# Patient Record
Sex: Male | Born: 1990 | Race: White | Hispanic: No | Marital: Single | Smoking: Never smoker
Health system: Southern US, Community
[De-identification: ages and names within clinical notes are randomized; demographics above are authoritative.]

---
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
## 2009-04-20 ENCOUNTER — Emergency Department (HOSPITAL_COMMUNITY): Admission: EM | Admit: 2009-04-20 | Discharge: 2009-04-21 | Payer: Self-pay | Admitting: Emergency Medicine

## 2009-05-13 ENCOUNTER — Telehealth: Payer: Self-pay | Admitting: Family Medicine

## 2009-05-14 ENCOUNTER — Telehealth: Payer: Self-pay | Admitting: Family Medicine

## 2009-05-14 ENCOUNTER — Ambulatory Visit: Payer: Self-pay | Admitting: Family Medicine

## 2009-05-14 DIAGNOSIS — B80 Enterobiasis: Secondary | ICD-10-CM

## 2009-05-18 ENCOUNTER — Telehealth: Payer: Self-pay | Admitting: *Deleted

## 2009-05-18 ENCOUNTER — Ambulatory Visit: Payer: Self-pay | Admitting: Family Medicine

## 2009-05-18 DIAGNOSIS — R030 Elevated blood-pressure reading, without diagnosis of hypertension: Secondary | ICD-10-CM

## 2009-05-18 DIAGNOSIS — H6120 Impacted cerumen, unspecified ear: Secondary | ICD-10-CM

## 2009-07-23 ENCOUNTER — Ambulatory Visit: Payer: Self-pay | Admitting: Family Medicine

## 2009-07-23 DIAGNOSIS — L290 Pruritus ani: Secondary | ICD-10-CM

## 2009-07-24 ENCOUNTER — Encounter: Payer: Self-pay | Admitting: Family Medicine

## 2009-07-24 ENCOUNTER — Telehealth: Payer: Self-pay | Admitting: Family Medicine

## 2010-01-09 ENCOUNTER — Emergency Department (HOSPITAL_COMMUNITY): Admission: EM | Admit: 2010-01-09 | Discharge: 2010-01-09 | Payer: Self-pay | Admitting: Emergency Medicine

## 2010-05-08 ENCOUNTER — Encounter: Payer: Self-pay | Admitting: Family Medicine

## 2010-05-08 ENCOUNTER — Emergency Department (HOSPITAL_COMMUNITY): Admission: EM | Admit: 2010-05-08 | Discharge: 2010-05-08 | Payer: Self-pay | Admitting: Emergency Medicine

## 2010-05-13 ENCOUNTER — Emergency Department (HOSPITAL_COMMUNITY): Admission: EM | Admit: 2010-05-13 | Discharge: 2010-05-13 | Payer: Self-pay | Admitting: Emergency Medicine

## 2010-06-05 ENCOUNTER — Emergency Department (HOSPITAL_COMMUNITY): Admission: EM | Admit: 2010-06-05 | Discharge: 2010-06-05 | Payer: Self-pay | Admitting: Emergency Medicine

## 2010-08-19 ENCOUNTER — Emergency Department (HOSPITAL_COMMUNITY): Admission: EM | Admit: 2010-08-19 | Discharge: 2010-08-19 | Payer: Self-pay | Admitting: Emergency Medicine

## 2010-10-31 NOTE — Assessment & Plan Note (Signed)
Summary: rash,tcb   Allergies: No Known Drug Allergies  pt came in for appt. c/o itchy rash x 1 month.  he was brought to me as he did not have any money or insurance & he has a previous balance in collections. states he is not working & lives at home. with his permission I called & spoke with mom. she has Jaynee Eagles but states he would have to apply for his own as he is an adult now. states she does not have money to bring for his appt. says she is 2 car payments behind & has multiple finanical issues. Director wanted to know if he had to be seen asap or if he could wait. he showed me lesions in different areas of his body that looked like scabies. I asked Dr. Sheffield Slider to come in & look at them. he did & I said it looked like it could be. He told him he could try permithrin otc. gave him directions how to use & printed info from the internet to review.  I sent him to [facility] to apply. She will have all of the mom's info & he will call us with what Ms Loleta Chance says about his eligibility. states he has no money for the otc med. mom said she does not either. reviewed [facility] formulary. they do not cover that ointment.  if he can get the ointment, use it & told him to wash bedding & clothes in very hot water. bleach if appropriate to fabric.  notify sexual contacts. do not share towels with others.  he left to go see [facility].Golden Circle RN  May 08, 2010 2:29 PM

## 2010-12-04 ENCOUNTER — Emergency Department (HOSPITAL_COMMUNITY)
Admission: EM | Admit: 2010-12-04 | Discharge: 2010-12-04 | Disposition: A | Discharge status: Home / Self Care | Payer: Self-pay | Attending: Emergency Medicine | Admitting: Emergency Medicine

## 2010-12-04 DIAGNOSIS — L259 Unspecified contact dermatitis, unspecified cause: Secondary | ICD-10-CM | POA: Insufficient documentation

## 2010-12-04 LAB — RAPID STREP SCREEN (MED CTR MEBANE ONLY): Streptococcus, Group A Screen (Direct): NEGATIVE

## 2010-12-12 LAB — URINALYSIS, ROUTINE W REFLEX MICROSCOPIC
Bilirubin Urine: NEGATIVE
Hgb urine dipstick: NEGATIVE
Nitrite: NEGATIVE
Protein, ur: NEGATIVE mg/dL
Specific Gravity, Urine: 1.022 (ref 1.005–1.030)
Urobilinogen, UA: 0.2 mg/dL (ref 0.0–1.0)

## 2011-01-16 IMAGING — CR DG ANKLE COMPLETE 3+V*L*
3 series · 3 of 3 positions shown · non-contrast
Comparison: None.

CLINICAL DATA: Injured playing basketball with pain and swelling
laterally

LEFT ANKLE COMPLETE - 3+ VIEW

[t ankle joint ap left]
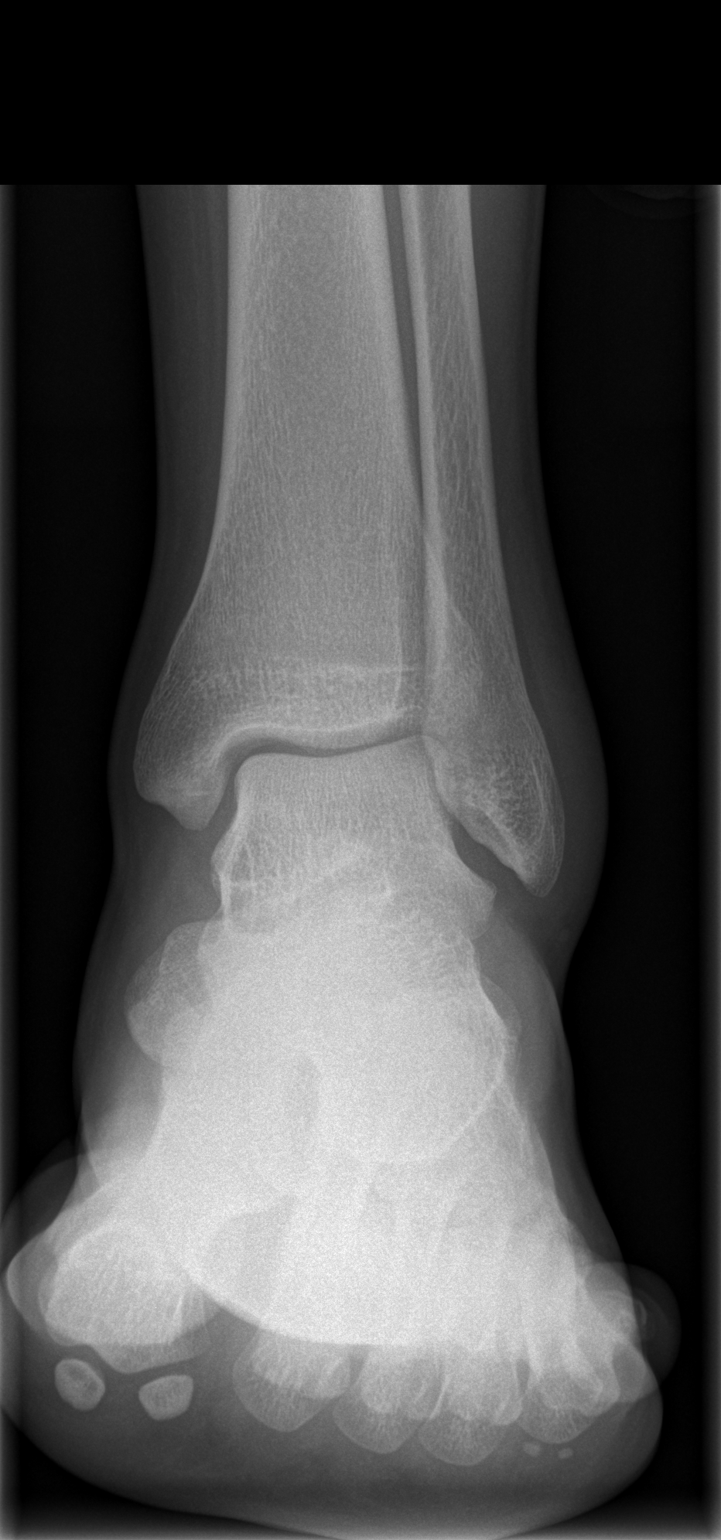

[t ankle joint oblique left]
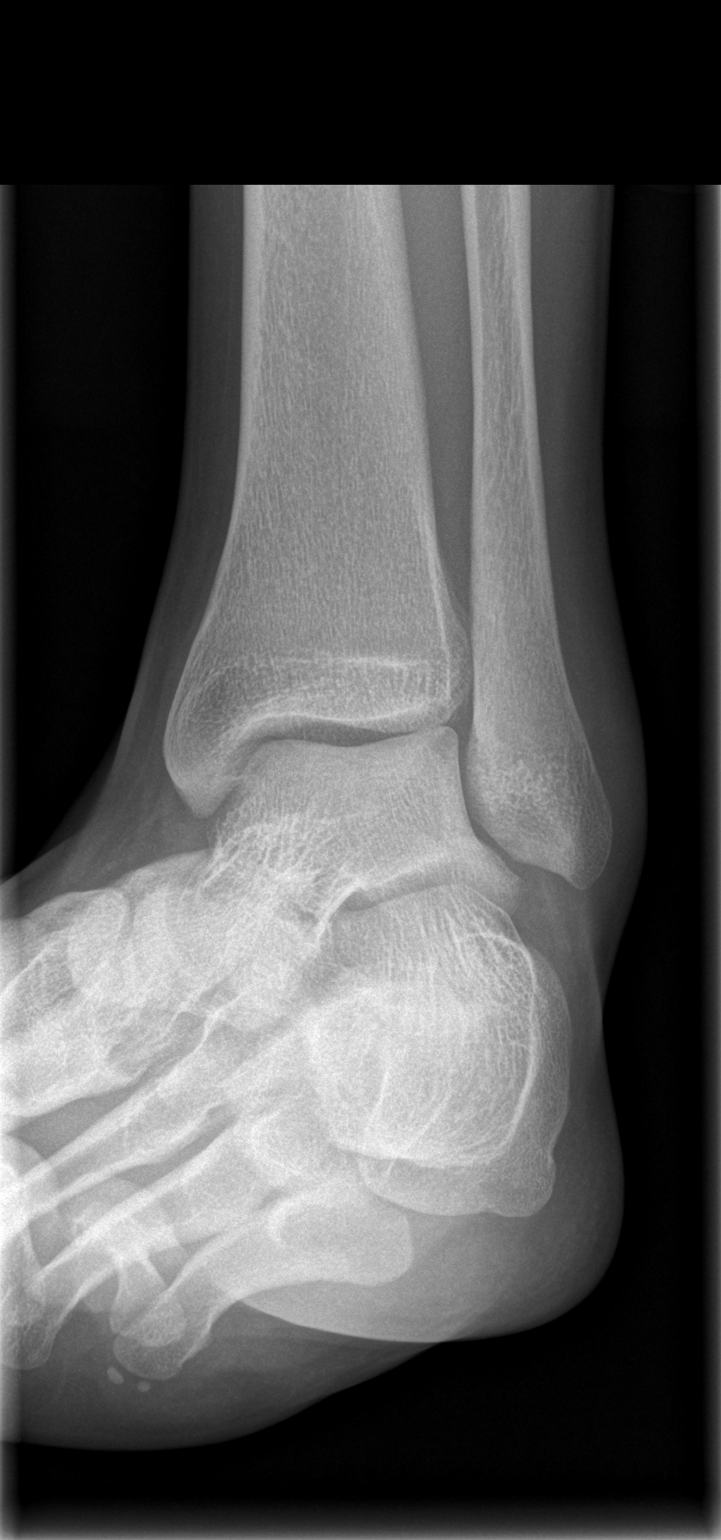

[t ankle joint lat left]
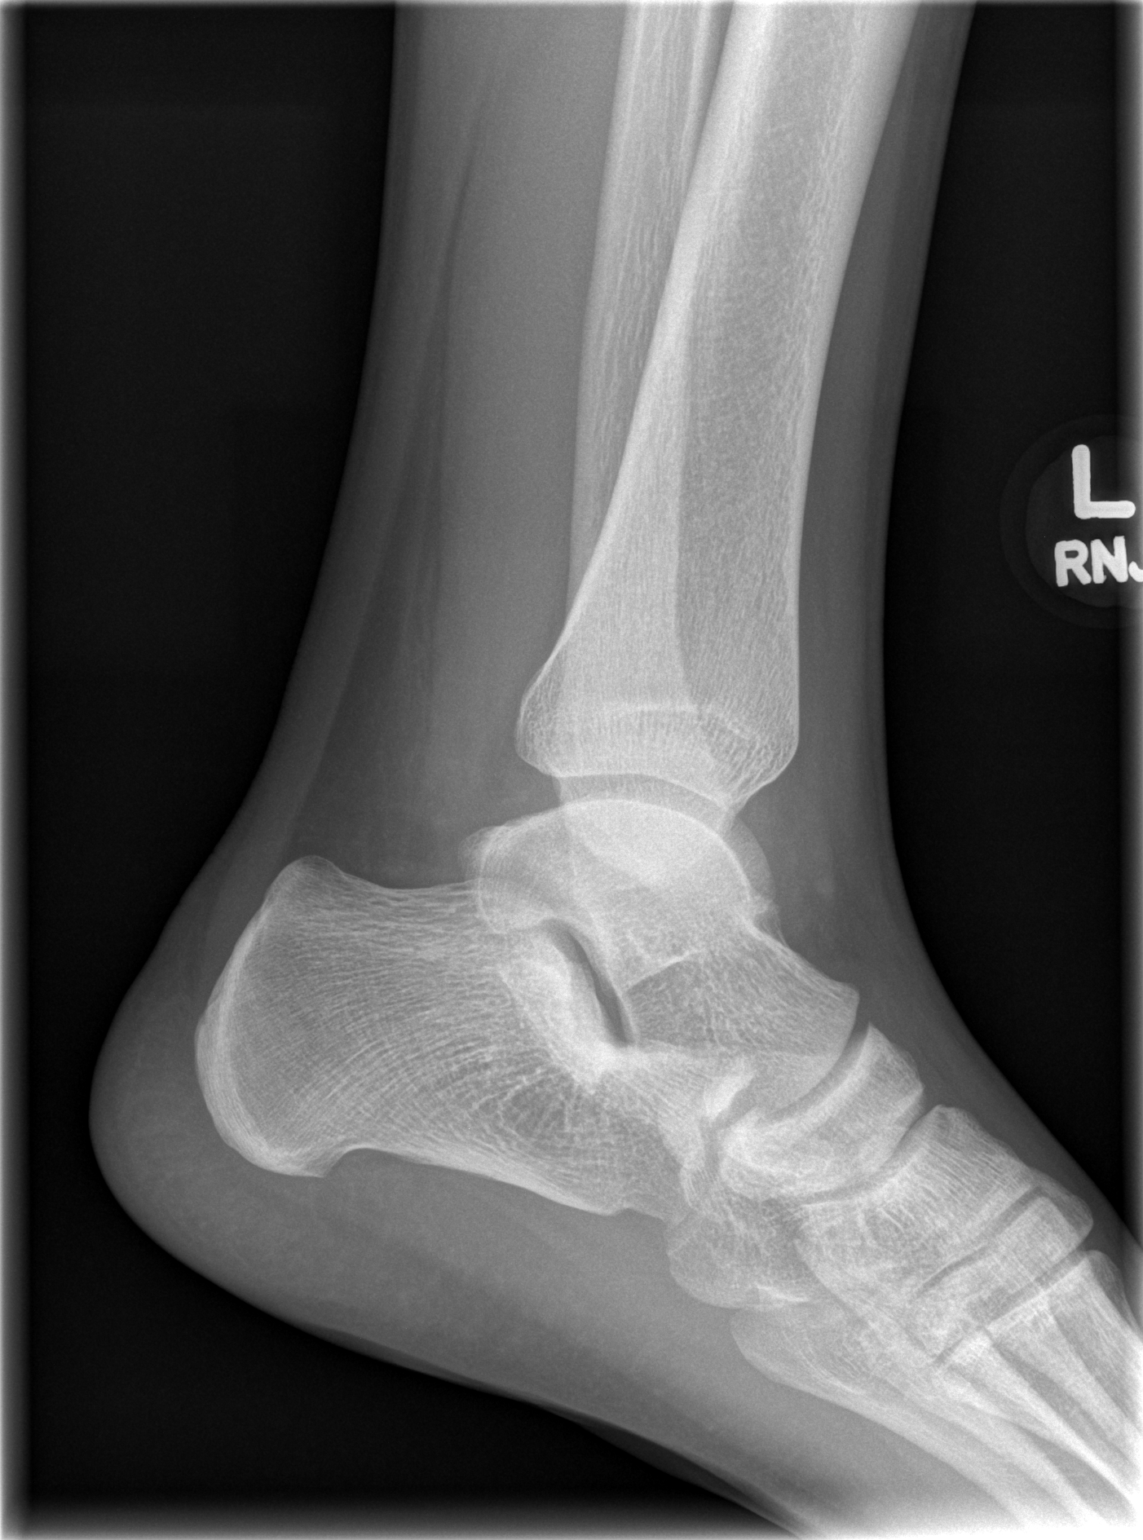

[3 of 3 positions shown; findings below may reference images not displayed]

FINDINGS: There is soft tissue swelling over the lateral malleolus.
However no acute fracture is seen.  The ankle joint appears normal.
Alignment is normal.
IMPRESSION: No acute bony abnormality.

## 2011-02-14 NOTE — Discharge Summary (Signed)
NAMEKENNIETH, PLOTTS NO.:  [id number]   MEDICAL RECORD NO.:  [id number]          PATIENT TYPE:  INP   LOCATION:  6153                         FACILITY:  [facility]   PHYSICIAN:  Pricilla Holm, M.D.DATE OF BIRTH:  1991/09/21   DATE OF ADMISSION:  11/01/2006  DATE OF DISCHARGE:  11/03/2006                               DISCHARGE SUMMARY   REASON FOR HOSPITALIZATION:  A 20 year old, was previously healthy male,  admitted for rhabdomyolysis.   SIGNIFICANT FINDINGS:  The patient admitted with a CK greater than  50,000.  Electrolytes stable upon admission.  He was started on  maintenance IV fluids x2 to maintain high urine output.  CK and  electrolytes followed.  CK trended down to 29,000 on February 5.  The  patient was febrile with upper respiratory symptoms during  hospitalization.  Rapid strep test was negative.  Treated with Tylenol.   TREATMENT:  IV fluid.  Oxycodone p.r.n. for pain.  No operations or  procedures.   FINAL DIAGNOSIS:  Rhabdomyolysis.   DISCHARGE MEDICATIONS:  None.   PENDING RESULTS:  None.   FOLLOWUP:  With Dr. [facility] on February 6 at 12 p.m.  noon.   DISCHARGE CONDITION:  Good.           ______________________________  Pricilla Holm, M.D.     SR/MEDQ  D:  11/03/2006  T:  11/03/2006  Job:  [id number]   cc:   Fonnie Mu, M.D.  Fax: [phone]

## 2011-05-06 ENCOUNTER — Emergency Department (HOSPITAL_COMMUNITY)
Admission: EM | Admit: 2011-05-06 | Discharge: 2011-05-06 | Disposition: A | Discharge status: Home / Self Care | Payer: Self-pay | Attending: Emergency Medicine | Admitting: Emergency Medicine

## 2011-05-06 DIAGNOSIS — R369 Urethral discharge, unspecified: Secondary | ICD-10-CM | POA: Insufficient documentation

## 2011-05-06 DIAGNOSIS — A64 Unspecified sexually transmitted disease: Secondary | ICD-10-CM | POA: Insufficient documentation

## 2011-05-07 LAB — GC/CHLAMYDIA PROBE AMP, GENITAL: Chlamydia, DNA Probe: POSITIVE — AB

## 2011-09-17 ENCOUNTER — Encounter: Payer: Self-pay | Admitting: Emergency Medicine

## 2011-09-17 ENCOUNTER — Emergency Department (HOSPITAL_COMMUNITY)
Admission: EM | Admit: 2011-09-17 | Discharge: 2011-09-17 | Disposition: A | Discharge status: Home / Self Care | Payer: Self-pay | Attending: Emergency Medicine | Admitting: Emergency Medicine

## 2011-09-17 DIAGNOSIS — H938X9 Other specified disorders of ear, unspecified ear: Secondary | ICD-10-CM | POA: Insufficient documentation

## 2011-09-17 NOTE — ED Provider Notes (Signed)
History     CSN: [id number] Arrival date & time: 09/17/2011 10:17 PM   First MD Initiated Contact with Patient 09/17/11 2250      Chief Complaint  Patient presents with  . Ear Fullness    feels like fullness in that ear    (Consider location/radiation/quality/duration/timing/severity/associated sxs/prior treatment) HPI Comments: Patient presents with a feeling of fullness and pressure in his right ear for 5 days. Patient states that he has had these symptoms since having an upper respiratory infection that included nasal congestion and runny nose. Patient has been taking pseudoephedrine without relief. He also states he had similar symptoms in the past with cerumen impaction and he has cleaned the ear canal with hydrogen peroxide. Patient denies change in hearing or tinnitus. Denies head trauma. Patient states that he is not currently feeling congested or having a runny nose.  Patient is a 20 y.o. male presenting with plugged ear sensation. The history is provided by the patient.  Ear Fullness This is a new problem. The current episode started in the past 7 days. The problem occurs constantly. The problem has been unchanged. Pertinent negatives include no abdominal pain, congestion, fever, headaches, myalgias, nausea, rash, sore throat or vomiting. The treatment provided no relief.    History reviewed. No pertinent past medical history.  History reviewed. No pertinent past surgical history.  No family history on file.  History  Substance Use Topics  . Smoking status: Never Smoker   . Smokeless tobacco: Never Used  . Alcohol Use: No      Review of Systems  Constitutional: Negative for fever.  HENT: Negative for hearing loss, ear pain, congestion, sore throat, rhinorrhea, tinnitus and ear discharge.   Eyes: Negative for redness.  Gastrointestinal: Negative for nausea, vomiting, abdominal pain, diarrhea and constipation.  Musculoskeletal: Negative for myalgias.  Skin:  Negative for rash.  Neurological: Negative for dizziness and headaches.    Allergies  Review of patient's allergies indicates no known allergies.  Home Medications   Current Outpatient Rx  Name Route Sig Dispense Refill  . PSEUDOEPHEDRINE HCL 60 MG PO TABS Oral Take 60 mg by mouth every 4 (four) hours as needed. Clear up ear liquid     . ADVIL COLD/SINUS PO Oral Take 1 capsule by mouth 2 (two) times daily.        BP 143/96  Pulse 79  Temp(Src) 97.9 F (36.6 C) (Oral)  Resp 18  SpO2 100%  Physical Exam  Nursing note and vitals reviewed. Constitutional: He is oriented to person, place, and time. He appears well-developed and well-nourished.  HENT:  Head: Normocephalic and atraumatic.  Right Ear: Hearing, tympanic membrane, external ear and ear canal normal.  Left Ear: Hearing, tympanic membrane, external ear and ear canal normal.  Nose: Nose normal.  Mouth/Throat: Uvula is midline and oropharynx is clear and moist.  Eyes: Conjunctivae are normal. Pupils are equal, round, and reactive to light. Right eye exhibits no discharge. Left eye exhibits no discharge.  Neck: Normal range of motion. Neck supple.  Neurological: He is alert and oriented to person, place, and time.  Skin: Skin is warm and dry.  Psychiatric: He has a normal mood and affect.    ED Course  Procedures (including critical care time)  Labs Reviewed - No data to display No results found.   1. Pressure sensation in ear    Patient was seen and examined. Patient had normal exam. No infection noted. Patient given ENT referral. Urged followup if symptoms  not improved in 1 week. Urged to return with fever, pain, change in hearing, or any other concerns. Patient verbalizes understanding and agrees with plan.   MDM  Patient with subjective ear fullness, no current URI symptoms or otitis media.        Eustace Moore [place], [place] 09/17/11 [phone]

## 2011-09-17 NOTE — ED Notes (Signed)
Feels like a fullness in right ear. Tried to wash that ear out but did not get anything out.

## 2011-09-19 NOTE — ED Provider Notes (Signed)
Medical screening examination/treatment/procedure(s) were performed by non-physician practitioner and as supervising physician I was immediately available for consultation/collaboration.   Vida Roller, MD 09/19/11 [phone]

## 2011-12-11 ENCOUNTER — Emergency Department (HOSPITAL_COMMUNITY)
Admission: EM | Admit: 2011-12-11 | Discharge: 2011-12-11 | Disposition: A | Discharge status: Home / Self Care | Payer: Self-pay | Attending: Emergency Medicine | Admitting: Emergency Medicine

## 2011-12-11 ENCOUNTER — Encounter (HOSPITAL_COMMUNITY): Payer: Self-pay

## 2011-12-11 DIAGNOSIS — L290 Pruritus ani: Secondary | ICD-10-CM | POA: Insufficient documentation

## 2011-12-11 MED ORDER — MEBENDAZOLE 100 MG PO CHEW
100.0000 mg | CHEWABLE_TABLET | Freq: Once | ORAL | Status: DC
  Filled 2011-12-11: qty 1

## 2011-12-11 MED ORDER — MEBENDAZOLE 100 MG PO CHEW: 100.0000 mg | CHEWABLE_TABLET | Freq: Once | ORAL | Status: AC

## 2011-12-11 MED ORDER — ZINC OXIDE 40 % EX OINT: TOPICAL_OINTMENT | CUTANEOUS | Status: DC | PRN

## 2011-12-11 MED ORDER — HYDROCORTISONE 1 % EX CREA: TOPICAL_CREAM | CUTANEOUS | Status: DC

## 2011-12-11 MED ORDER — MEBENDAZOLE 100 MG PO CHEW: 100.0000 mg | CHEWABLE_TABLET | Freq: Once | ORAL | Status: DC

## 2011-12-11 NOTE — ED Notes (Signed)
Pt c/o "itchy butt", states hx of pen worms x3 and this feels the same.

## 2011-12-11 NOTE — Discharge Instructions (Signed)
Please take your first dose of medication today. Please take the second dose two weeks from today (March 28). Use the hydrocortisone and Desitin as needed. Return to urgent care for worsening condition.  RESOURCE GUIDE  Dental Problems  Patients with Medicaid: [facility] [phone] [address]                                           [phone] [address] Phone:  [phone]                                                  Phone:  [phone]  If unable to pay or uninsured, contact:  [facility] or [facility]. to become qualified for the adult dental clinic.  Chronic Pain Problems Contact [facility]  [phone] Patients need to be referred by their primary care doctor.  Insufficient Money for Medicine Contact [employer]:  call "211" or [facility] 332-647-2298.  No Primary Care Doctor Call [facility]  9490874095 Other agencies that provide inexpensive medical care    [facility]  [phone]    [facility]  [phone]    [facility]  7432886526    [facility]  [phone]    [facility]  [phone]    [facility]  [phone]  Psychological Services [facility]  [phone] [facility] Services  [phone] [facility] Mental Health   [phone] (emergency services 414-457-7524)  Substance Abuse Resources [facility]  260-434-1454 [facility] (774)686-2025 [facility] ([phone] Floyd[facility] Flock (606)[phone] Residential & Outpatient Substance Abuse Program  312-149-1752  Abuse/Neglect [facility] Child Abuse Hotline 270-471-9870 [facility] Child Abuse Hotline 508-207-6990 (After Hours)  Emergency Shelter [facility] (204)564-4655  Maternity Homes [facility] at [facility] [place] [facility] [place] 224-087-0202 [facility] 5098409874  MRSA Hotline #:    779-081-7964    [facility] Resources  [facility]     United [employer]                          [facility] Dept. [address] [place]                       [address]      [address]                                                  Cristobal Goldmann Phone:  [phone]  Phone:  [phone]                 Phone:  [phone]  [facility] Mental Health Phone:  ([phone]  [facility] Child Abuse Hotline 636-732-4220 [phone]) 368-9211 (After Hours)  Anal Itching Itching around the anus is a common problem. It is usually not dangerous. It often is caused by skin irritation from stool, moisture, soaps, or clothing. Other causes are pinworms, especially if the itching is worse at night. In adults, the itching may be due to hemorrhoids. In some cases, the cause is unknown. Itching usually can be controlled by keeping the anal area clean and dry. CAUSES   Loose or sticky stool from diarrhea or rectal leakage.   Hemorrhoids. They allow stool to stick to the rectal area.   Certain foods. Be sure to discuss your diet with your caregiver.   Dry skin or skin diseases.   Infections such as a local yeast infection or certain sexually transmitted diseases (STDs).   Worms (parasites).   Diseases of the anus. These include abscesses, fissures, fistulas or cancer.   Sometimes a cause cannot be found.  DIAGNOSIS   Your caregiver will take your history and examine you. A careful exam of the anus is important. Your caregiver will inspect the outer area of your anus and will do a rectal exam.   Sometimes your caregiver will need to look inside the anus. This is a simple procedure that may be a little uncomfortable but usually does not require anesthesia.   If abnormalities are found, a biopsy might be done or you may be referred to a specialist.  TREATMENT  The treatment  of your condition will depend on the cause.   Your caregiver will advise you on treatment of any disease found.   If you have rectal leakage or loose stools, a diet high in fiber or a fiber supplement should improve your condition.   Avoid foods or substances that might be causing your itching.   Gentle care of your anal area is important to avoid worsening the irritation.  HOME CARE INSTRUCTIONS  Do not rub or scratch the area. This makes the itching worse. It could worsen conditions such as parasite infections.   After every bowel movement and at bedtime, gently clean the anal area. Bathe or use moistened tissue or soft wash cloth. You also may use pre-moistened anal cleansing pads or tissues made for cleaning up babies. Do not use soap. Gently pat the area dry.   Wear underwear made of cotton or with a cotton crotch. Do not wear tight fitting clothes or underwear that keep moisture in.   Avoid foods and beverages that may cause anal itching. Examples are beer, tea, coffee, milk, cola, tomatoes, citrus fruits, nuts, chocolate, and spicy foods.   Be sure you have enough fiber in your diet.   Do not use products that may irritate the anal skin. These include perfumed or colored toilet paper, deodorant sprays, and perfumed soaps.   Do not use any medication on the anal area unless advised. Some products may make itching worse.   It may take a few weeks for things to fully improve.  SEEK MEDICAL CARE IF:   The itching is not better in 3 to 4 days or is getting worse.   The skin around the anus becomes red or tender. This may be a sign of infection.   You have pain in the anus, especially with bowel movement.  SEEK  IMMEDIATE MEDICAL CARE IF:  You have increasing pain in the anus or in the abdomen.   You have blood coming from the anus.   You have pus or other discharge from the anus.   You develop a fever.  Document Released: 09/12/2000 Document Revised: 09/04/2011 Document  Reviewed: 09/19/2008 [facility] Patient Information 2012 [place], [place].

## 2011-12-11 NOTE — ED Provider Notes (Signed)
History     CSN: [id number]  Arrival date & time 12/11/11  1209   First MD Initiated Contact with Patient 12/11/11 1216      Chief Complaint  Patient presents with  . Pruritis    itching to buttocks     (Consider location/radiation/quality/duration/timing/severity/associated sxs/prior treatment) HPI History obtained from the patient. 21 year old male presents with "itchy butt." He states that he has had itching around his rectum for approximately the last 5-7 days. Has had normal bowel movements without any evidence for rectal bleeding. Denies history of hemorrhoids, but states that he has had pinworms twice in the past, once as a child and once about 2 years ago. States this feels somewhat similar to those episodes. Itching is present day and night. Has not tried anything at home to treat. Denies abdominal pain, nausea, vomiting, diarrhea. No fever/chills. Normal appetite.  History reviewed. No pertinent past medical history.  History reviewed. No pertinent past surgical history.  No family history on file.  History  Substance Use Topics  . Smoking status: Never Smoker   . Smokeless tobacco: Never Used  . Alcohol Use: No      Review of Systems as per history of present illness  Allergies  Review of patient's allergies indicates no known allergies.  Home Medications   Current Outpatient Rx  Name Route Sig Dispense Refill  . PSEUDOEPHEDRINE HCL 60 MG PO TABS Oral Take 60 mg by mouth every 4 (four) hours as needed. Clear up ear liquid     . ADVIL COLD/SINUS PO Oral Take 1 capsule by mouth 2 (two) times daily.        BP 139/83  Pulse 65  Temp(Src) 97.9 F (36.6 C) (Oral)  Resp 16  SpO2 100%  Physical Exam  Nursing note and vitals reviewed. Constitutional: He appears well-developed and well-nourished. No distress.  HENT:  Head: Normocephalic and atraumatic.  Cardiovascular: Normal rate.   Pulmonary/Chest: Effort normal.  Abdominal: Soft. There is no  tenderness. There is no rebound and no guarding.  Genitourinary: Rectum normal.       External rectal exam with no evidence for anal fissure, external hemorrhoid. Skin does not appear excoriated or otherwise irritated. No tenderness or mass noted on digital rectal exam. No evidence for internal hemorrhoids.  Neurological: He is alert.  Skin: Skin is warm and dry. No rash noted. He is not diaphoretic.  Psychiatric: He has a normal mood and affect.    ED Course  Procedures (including critical care time)  Labs Reviewed - No data to display No results found.   1. ANAL PRURITUS       MDM  Patient presents with anal pruritus. No obvious evidence on exam for etiology. Considering his history, will treat empirically for pinworms. Return precautions discussed.      Grant [place], [place] 12/11/11 1719

## 2011-12-13 NOTE — ED Provider Notes (Signed)
Medical screening examination/treatment/procedure(s) were performed by non-physician practitioner and as supervising physician I was immediately available for consultation/collaboration.  Celene Kras, MD 12/13/11 [phone]

## 2012-04-28 ENCOUNTER — Emergency Department (HOSPITAL_COMMUNITY)
Admission: EM | Admit: 2012-04-28 | Discharge: 2012-04-29 | Disposition: A | Discharge status: Home / Self Care | Payer: Self-pay | Attending: Emergency Medicine | Admitting: Emergency Medicine

## 2012-04-28 ENCOUNTER — Encounter (HOSPITAL_COMMUNITY): Payer: Self-pay | Admitting: Family Medicine

## 2012-04-28 DIAGNOSIS — M25519 Pain in unspecified shoulder: Secondary | ICD-10-CM | POA: Insufficient documentation

## 2012-04-28 NOTE — ED Notes (Signed)
Patient states his has been having left shoulder pain for the past month and it has gotten progressively worse. States "I have been working out hardcore for the past 4 months." States he tried resting it for 2 weeks and pain returned with exertion.

## 2012-04-29 NOTE — ED Provider Notes (Signed)
History     CSN: [id number]  Arrival date & time 04/28/12  2131   First MD Initiated Contact with Patient 04/29/12 0020      Chief Complaint  Patient presents with  . Shoulder Pain     The history is provided by the patient.   the patient reports ongoing left shoulder pain for approximately one month.  He continues to lift heavy weights on his left shoulder reports it seems to be more sore after workouts.  He has no pain in between workouts.  Denies weakness or numbness of his left upper extremity.  His had no fevers or chills.  His pain is mild to moderate  History reviewed. No pertinent past medical history.  History reviewed. No pertinent past surgical history.  No family history on file.  History  Substance Use Topics  . Smoking status: Never Smoker   . Smokeless tobacco: Never Used  . Alcohol Use: No      Review of Systems  All other systems reviewed and are negative.    Allergies  Review of patient's allergies indicates no known allergies.  Home Medications  No current outpatient prescriptions on file.  BP 137/72  Pulse 49  Temp 97.7 F (36.5 C) (Oral)  Wt 211 lb 2 oz (95.766 kg)  SpO2 100%  Physical Exam  Nursing note and vitals reviewed. Constitutional: He is oriented to person, place, and time. He appears well-developed and well-nourished.  HENT:  Head: Normocephalic and atraumatic.  Eyes: EOM are normal.  Neck: Normal range of motion.  Pulmonary/Chest: Effort normal.  Musculoskeletal: Normal range of motion.       Full range of motion of left shoulder.  Normal left radial pulse.  No deformity of his left shoulder.  No erythema warmth or tenderness.  No focal tenderness of his left a.c. joint  Neurological: He is alert and oriented to person, place, and time.  Skin: Skin is warm and dry.  Psychiatric: He has a normal mood and affect. Judgment normal.    ED Course  Procedures (including critical care time)  Labs Reviewed - No data to  display No results found.   1. Shoulder pain       MDM  I recommended the patient stop lifting weights on his left shoulder until followup with orthopedic surgeon.  There is no indication for imaging or other evaluation emergency department.        Lyanne Co, MD 04/29/12 [phone]

## 2012-04-29 NOTE — ED Notes (Signed)
Pt reports pain in his left shoulder that increases after working out.  Pt. Reports "popping" in his left shoulder.  Denies numbness, tingling, or pain.

## 2012-04-29 NOTE — ED Notes (Signed)
MD at bedside. 

## 2014-02-03 ENCOUNTER — Emergency Department (HOSPITAL_BASED_OUTPATIENT_CLINIC_OR_DEPARTMENT_OTHER): Payer: Self-pay

## 2014-02-03 ENCOUNTER — Encounter (HOSPITAL_BASED_OUTPATIENT_CLINIC_OR_DEPARTMENT_OTHER): Payer: Self-pay | Admitting: Emergency Medicine

## 2014-02-03 ENCOUNTER — Emergency Department (HOSPITAL_BASED_OUTPATIENT_CLINIC_OR_DEPARTMENT_OTHER)
Admission: EM | Admit: 2014-02-03 | Discharge: 2014-02-03 | Disposition: A | Discharge status: Home / Self Care | Payer: Self-pay | Attending: Emergency Medicine | Admitting: Emergency Medicine

## 2014-02-03 DIAGNOSIS — Z791 Long term (current) use of non-steroidal anti-inflammatories (NSAID): Secondary | ICD-10-CM | POA: Insufficient documentation

## 2014-02-03 DIAGNOSIS — J069 Acute upper respiratory infection, unspecified: Secondary | ICD-10-CM | POA: Insufficient documentation

## 2014-02-03 DIAGNOSIS — J029 Acute pharyngitis, unspecified: Secondary | ICD-10-CM | POA: Insufficient documentation

## 2014-02-03 NOTE — ED Notes (Signed)
Third day of cough and congestion felt like he had chills last night is afebrile at present , pt does report green sputum when coughing

## 2014-02-03 NOTE — ED Notes (Signed)
Patient transported to X-ray 

## 2014-02-03 NOTE — ED Provider Notes (Signed)
CSN: [id number]     [id number] date & time 02/03/14  [id number] History   First MD Initiated Contact with Patient 02/03/14 1018     Chief Complaint  Patient presents with  . cough and congestion      (Consider location/radiation/quality/duration/timing/severity/associated sxs/prior Treatment) Patient is a 23 y.o. male presenting with URI. The history is provided by the patient.  URI Presenting symptoms: congestion, cough, rhinorrhea and sore throat   Cough:    Cough characteristics:  Productive   Sputum characteristics:  Green   Severity:  Moderate   Onset quality:  Gradual   Duration:  3 days   Timing:  Intermittent   Chronicity:  New Severity:  Moderate Onset quality:  Gradual Duration:  3 days Timing:  Constant Progression:  Worsening Chronicity:  New Relieved by:  Nothing Worsened by:  Nothing tried Ineffective treatments:  None tried Associated symptoms: no neck pain, no sinus pain and no wheezing   Associated symptoms comment:  No sob  Risk factors: sick contacts   Risk factors: no chronic respiratory disease and no diabetes mellitus   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 No pertinent past medical history. History reviewed. No pertinent past surgical history. History reviewed. No pertinent family history. History  Substance Use Topics  . Smoking status: Never Smoker   . Smokeless tobacco: Never Used  . Alcohol Use: No    Review of Systems  HENT: Positive for congestion, rhinorrhea and sore throat.   Respiratory: Positive for cough. Negative for wheezing.   Musculoskeletal: Negative for neck pain.  All other systems reviewed and are negative.     Allergies  Review of patient's allergies indicates no known allergies.  Home Medications   Prior to Admission medications   Medication Sig Start Date End Date Taking? Authorizing Provider  ibuprofen (ADVIL,MOTRIN) 200 MG tablet Take 400 mg by mouth every 6 (six) hours as needed. For  pain    Historical Provider, MD   BP 144/75  Pulse 88  Temp(Src) 98.5 F (36.9 C) (Oral)  Resp 18  Ht 6\' 2"  (1.88 m)  Wt 217 lb (98.431 kg)  BMI 27.85 kg/m2  SpO2 99% Physical Exam  Nursing note and vitals reviewed. Constitutional: He is oriented to person, place, and time. He appears well-developed and well-nourished. No distress.  HENT:  Head: Normocephalic and atraumatic.  Right Ear: Tympanic membrane and ear canal normal.  Left Ear: Tympanic membrane and ear canal normal.  Nose: Mucosal edema and rhinorrhea present.  Mouth/Throat: Posterior oropharyngeal erythema present. No oropharyngeal exudate, posterior oropharyngeal edema or tonsillar abscesses.  Eyes: Conjunctivae and EOM are normal. Pupils are equal, round, and reactive to light.  Neck: Normal range of motion. Neck supple.  Cardiovascular: Normal rate, regular rhythm and intact distal pulses.   No murmur heard. Pulmonary/Chest: Effort normal and breath sounds normal. No respiratory distress. He has no wheezes. He has no rales.  Abdominal: Soft. He exhibits no distension. There is no tenderness. There is no rebound and no guarding.  Musculoskeletal: Normal range of motion. He exhibits no edema and no tenderness.  Neurological: He is alert and oriented to person, place, and time.  Skin: Skin is warm and dry. No rash noted. No erythema.  Psychiatric: He has a normal mood and affect. His behavior is normal.    ED Course  Procedures (including critical care time) Labs Review Labs Reviewed - No data to display  Imaging Review Dg Chest 2 View  02/03/2014   CLINICAL DATA:  Cough and congestion.  EXAM: CHEST  2 VIEW  COMPARISON:  None.  FINDINGS: Heart size is normal. Mediastinal shadows are normal. The lungs are clear. No effusions. No bony abnormalities.  IMPRESSION: Normal chest   Electronically Signed   By: Paulina FusiMark  Shogry M.D.   On: 02/03/2014 11:22     EKG Interpretation None      MDM   Final diagnoses:  URI  (upper respiratory infection)   Pt with symptoms consistent with viral URI.  Well appearing here.  No signs of breathing difficulty  No signs of pharyngitis, otitis or abnormal abdominal findings.   CXR wnl and pt to return with any further problems.     Gwyneth SproutWhitney Ameliyah Sarno, MD 02/03/14 1134

## 2016-12-02 DIAGNOSIS — F17200 Nicotine dependence, unspecified, uncomplicated: Secondary | ICD-10-CM | POA: Insufficient documentation

## 2016-12-30 DIAGNOSIS — M25561 Pain in right knee: Secondary | ICD-10-CM | POA: Insufficient documentation

## 2017-11-26 DIAGNOSIS — I10 Essential (primary) hypertension: Secondary | ICD-10-CM | POA: Insufficient documentation

## 2018-11-05 ENCOUNTER — Encounter: Payer: Self-pay | Admitting: Family Medicine

## 2018-11-05 ENCOUNTER — Ambulatory Visit (INDEPENDENT_AMBULATORY_CARE_PROVIDER_SITE_OTHER): Payer: Self-pay | Admitting: Family Medicine

## 2018-11-05 VITALS — BP 130/80 | HR 116 | Temp 101.4°F | Resp 14 | Wt 239.0 lb

## 2018-11-05 DIAGNOSIS — R0981 Nasal congestion: Secondary | ICD-10-CM

## 2018-11-05 DIAGNOSIS — R6889 Other general symptoms and signs: Secondary | ICD-10-CM

## 2018-11-05 LAB — POCT INFLUENZA A/B
INFLUENZA A, POC: NEGATIVE
INFLUENZA B, POC: NEGATIVE

## 2018-11-05 MED ORDER — AZELASTINE HCL 0.1 % NA SOLN: 1.0000 | Freq: Two times a day (BID) | NASAL | 0 refills | Status: AC

## 2018-11-05 MED ORDER — OSELTAMIVIR PHOSPHATE 75 MG PO CAPS: 75.0000 mg | ORAL_CAPSULE | Freq: Two times a day (BID) | ORAL | 0 refills | Status: AC

## 2018-11-05 NOTE — Patient Instructions (Signed)

## 2018-11-05 NOTE — Progress Notes (Signed)
Brent Moreno is a 28 y.o. male who presents today with 2 days of abrupt onset of cold and flu symptoms. 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 He denies any significany medical history and reports he was" perfectly healthy" 2 days ago.   Review of Systems  Constitutional: Positive for chills, fever and malaise/fatigue.  HENT: Positive for congestion. Negative for ear discharge, ear pain, sinus pain and sore throat.   Eyes: Negative.   Respiratory: Positive for cough. Negative for sputum production and shortness of breath.   Cardiovascular: Negative.  Negative for chest pain.  Gastrointestinal: Negative for abdominal pain, diarrhea, nausea and vomiting.  Genitourinary: Negative for dysuria, frequency, hematuria and urgency.  Musculoskeletal: Negative for myalgias.  Skin: Negative.   Neurological: Negative for headaches.  Endo/Heme/Allergies: Negative.   Psychiatric/Behavioral: Negative.     Brent Moreno has a current medication list which includes the following prescription(s): azelastine, ibuprofen, and oseltamivir. Also has No Known Allergies.  Brent Moreno  has no past medical history on file. Also  has no past surgical history on file.    O: Vitals:   11/05/18 1521  BP: 130/80  Pulse: (!) 116  Resp: 14  Temp: (!) 101.4 F (38.6 C)  SpO2: 96%     Physical Exam Vitals signs (febrile and tachycardic) reviewed.  Constitutional:      General: He is not in acute distress.    Appearance: He is well-developed. He is not ill-appearing, toxic-appearing or diaphoretic.  HENT:     Head: Normocephalic.     Right Ear: Hearing, tympanic membrane, ear canal and external ear normal.     Left Ear: Hearing, tympanic membrane, ear canal and external ear normal.     Nose: Congestion and rhinorrhea present.     Right Sinus: No maxillary  sinus tenderness or frontal sinus tenderness.     Left Sinus: No maxillary sinus tenderness or frontal sinus tenderness.     Mouth/Throat:     Lips: Pink.     Mouth: Mucous membranes are moist.     Pharynx: Uvula midline. Posterior oropharyngeal erythema present. No pharyngeal swelling, oropharyngeal exudate or uvula swelling.     Tonsils: No tonsillar exudate or tonsillar abscesses. Swelling: 2+ on the right. 3+ on the left.  Eyes:     General: Lids are normal.     Pupils: Pupils are equal, round, and reactive to light.  Neck:     Musculoskeletal: Full passive range of motion without pain, normal range of motion and neck supple.  Cardiovascular:     Rate and Rhythm: Regular rhythm. Tachycardia present.     Pulses: Normal pulses.     Heart sounds: Normal heart sounds.  Pulmonary:     Effort: Pulmonary effort is normal.     Breath sounds: Normal breath sounds. No decreased breath sounds, wheezing, rhonchi or rales.  Abdominal:     General: Bowel sounds are normal.     Palpations: Abdomen is soft.  Musculoskeletal: Normal range of motion.  Lymphadenopathy:     Head:     Right side of head: Tonsillar adenopathy present. No submental or submandibular adenopathy.     Left side of head: Tonsillar adenopathy present. No submental or submandibular adenopathy.     Cervical: Cervical adenopathy present.     Right cervical: Superficial cervical adenopathy present.  Left cervical: Superficial cervical adenopathy present.  Skin:    General: Skin is warm.  Neurological:     Mental Status: He is alert and oriented to person, place, and time.  Psychiatric:        Mood and Affect: Mood normal.        Behavior: Behavior is cooperative.     A: 1. Flu-like symptoms   2. Nasal congestion     P: Negative POCT but abrupt new onset of flu like symptoms in the last 48 hours in a community that has high influenza rate at this time. Patient who is otherwise healthy and was without symptoms that  works a hospital. Will treat with antiviral and supportive care and provided 72 hour work note. Patient is overall young and healthy without co-morbid condition- non-toxic appearing on exam despite elevated vital signs with tylenol/motrin support, antiviral and rest patient should recover well.  1. Flu-like symptoms - POCT Influenza A/B - oseltamivir (TAMIFLU) 75 MG capsule; Take 1 capsule (75 mg total) by mouth 2 (two) times daily for 5 days. Results for orders placed or performed in visit on 11/05/18 (from the past 24 hour(s))  POCT Influenza A/B     Status: None   Collection Time: 11/05/18  3:37 PM  Result Value Ref Range   Influenza A, POC Negative Negative   Influenza B, POC Negative Negative     2. Nasal congestion - azelastine (ASTELIN) 0.1 % nasal spray; Place 1 spray into both nostrils 2 (two) times daily. Use in each nostril as directed   Discussed with patient exam findings, suspected diagnosis etiology and  reviewed recommended treatment plan and follow up, including complications and indications for urgent medical follow up and evaluation. Medications including use and indications reviewed with patient. Patient provided relevant patient education on diagnosis and/or relevant related condition that were discussed and reviewed with patient at discharge. Patient verbalized understanding of information provided and agrees with plan of care (POC), all questions answered.

## 2019-10-08 ENCOUNTER — Other Ambulatory Visit: Payer: Self-pay

## 2019-10-08 ENCOUNTER — Encounter (HOSPITAL_COMMUNITY): Payer: Self-pay

## 2019-10-08 ENCOUNTER — Ambulatory Visit (HOSPITAL_COMMUNITY)
Admission: EM | Admit: 2019-10-08 | Discharge: 2019-10-08 | Disposition: A | Discharge status: Home / Self Care | Payer: No Typology Code available for payment source | Attending: Emergency Medicine | Admitting: Emergency Medicine

## 2019-10-08 DIAGNOSIS — J019 Acute sinusitis, unspecified: Secondary | ICD-10-CM

## 2019-10-08 MED ORDER — AMOXICILLIN-POT CLAVULANATE 875-125 MG PO TABS: 1.0000 | ORAL_TABLET | Freq: Two times a day (BID) | ORAL | 0 refills | Status: AC

## 2019-10-08 MED ORDER — PREDNISONE 50 MG PO TABS: 50.0000 mg | ORAL_TABLET | Freq: Every day | ORAL | 0 refills | Status: AC

## 2019-10-08 NOTE — ED Triage Notes (Addendum)
Pt reports having sinus pressure, runny nose, headcahe x 2 1/2 weeks. Pt states when his blow his nose he sees some blood spots. Pt states he stopped using Flonase because increases the nose bleeding.   Pt do not want a COVID test.

## 2019-10-08 NOTE — Discharge Instructions (Signed)
Begin Augmentin twice daily for 1 week Prednisone daily x 5 days, take with food and in the morning if able Follow up if not improving

## 2019-10-09 NOTE — ED Provider Notes (Signed)
[facility]    CSN: [id number] Arrival date & time: 10/08/19  1242      History   Chief Complaint Chief Complaint  Patient presents with  . Sinus Problem    HPI Brent Moreno is a 29 y.o. male no contributing past medical history presenting today for evaluation of sinus congestion and pressure.  Patient states that for the past 2.5 weeks he has had a lot of pressure within his sinuses frontal and maxillary.  He has had a lot of congestion and drainage.  He has been using Claritin and Flonase.  Has recently stopped using Flonase as he has felt this has increased the amount of blood that he sees within the mucus.  He denies any fevers chills or body aches.  Denies any cough, chest pain or shortness of breath.  Denies any Covid exposure, has not had any Covid testing within course of illness.  Is wondering if this could have potentially been caused by exercising vigorously with mask on.  HPI  History reviewed. No pertinent past medical history.  Patient Active Problem List   Diagnosis Date Noted  . ANAL PRURITUS 07/23/2009  . CERUMEN IMPACTION, LEFT 05/18/2009  . ELEVATED BLOOD PRESSURE WITHOUT DIAGNOSIS OF HYPERTENSION 05/18/2009  . PINWORMS 05/14/2009  . REDNESS OR DISCHARGE OF EYE 10/04/2008  . UPPER RESPIRATORY INFECTION, VIRAL 10/04/2008  . RHABDOMYOLYSIS 09/06/2007  . BACK PAIN 08/16/2007    History reviewed. No pertinent surgical history.     Home Medications    Prior to Admission medications   Medication Sig Start Date End Date Taking? Authorizing Provider  acetaminophen (TYLENOL) 500 MG tablet Take 500 mg by mouth every 6 (six) hours as needed.   Yes [provider]  amoxicillin-clavulanate (AUGMENTIN) 875-125 MG tablet Take 1 tablet by mouth every 12 (twelve) hours for 7 days. 10/08/19 10/15/19  Jammi Morrissette C, PA-C  azelastine (ASTELIN) 0.1 % nasal spray Place 1 spray into both nostrils 2 (two) times daily. Use in each nostril as directed  11/05/18   Zachery Dauer, NP  ibuprofen (ADVIL,MOTRIN) 200 MG tablet Take 400 mg by mouth every 6 (six) hours as needed. For pain    [provider]  predniSONE (DELTASONE) 50 MG tablet Take 1 tablet (50 mg total) by mouth daily for 5 days. 10/08/19 10/13/19  Ira Dougher, Junius Creamer, PA-C    Family History History reviewed. No pertinent family history.  Social History Social History   Tobacco Use  . Smoking status: Never Smoker  . Smokeless tobacco: Never Used  Substance Use Topics  . Alcohol use: No  . Drug use: No     Allergies   Patient has no known allergies.   Review of Systems Review of Systems  Constitutional: Negative for activity change, appetite change, chills, fatigue and fever.  HENT: Positive for congestion, rhinorrhea and sinus pressure. Negative for ear pain, sore throat and trouble swallowing.   Eyes: Negative for discharge and redness.  Respiratory: Negative for cough, chest tightness and shortness of breath.   Cardiovascular: Negative for chest pain.  Gastrointestinal: Negative for abdominal pain, diarrhea, nausea and vomiting.  Musculoskeletal: Negative for myalgias.  Skin: Negative for rash.  Neurological: Positive for headaches. Negative for dizziness and light-headedness.     Physical Exam Triage Vital Signs ED Triage Vitals  Enc Vitals Group     BP 10/08/19 1420 136/72     Pulse Rate 10/08/19 1420 72     Resp 10/08/19 1420 16  Temp 10/08/19 1420 97.6 F (36.4 C)     Temp Source 10/08/19 1420 Oral     SpO2 10/08/19 1420 97 %     Weight --      Height --      Head Circumference --      Peak Flow --      Pain Score 10/08/19 1418 7     Pain Loc --      Pain Edu? --      Excl. in [place]? --    No data found.  Updated Vital Signs BP 136/72 (BP Location: Left Arm)   Pulse 72   Temp 97.6 F (36.4 C) (Oral)   Resp 16   SpO2 97%   Visual Acuity Right Eye Distance:   Left Eye Distance:   Bilateral Distance:    Right Eye Near:   Left  Eye Near:    Bilateral Near:     Physical Exam Vitals and nursing note reviewed.  Constitutional:      Appearance: He is well-developed.  HENT:     Head: Normocephalic and atraumatic.     Ears:     Comments: Bilateral ears without tenderness to palpation of external auricle, tragus and mastoid, EAC's without erythema or swelling, TM's with good bony landmarks and cone of light. Non erythematous.    Nose:     Comments: Nasal mucosa appears erythematous, no significantly swollen turbinates, various areas of irritation    Mouth/Throat:     Comments: Oral mucosa pink and moist, no tonsillar enlargement or exudate. Posterior pharynx patent and nonerythematous, no uvula deviation or swelling. Normal phonation. Eyes:     Conjunctiva/sclera: Conjunctivae normal.  Cardiovascular:     Rate and Rhythm: Normal rate and regular rhythm.     Heart sounds: No murmur.  Pulmonary:     Effort: Pulmonary effort is normal. No respiratory distress.     Breath sounds: Normal breath sounds.     Comments: Breathing comfortably at rest, CTABL, no wheezing, rales or other adventitious sounds auscultated Abdominal:     Palpations: Abdomen is soft.     Tenderness: There is no abdominal tenderness.  Musculoskeletal:     Cervical back: Neck supple.  Skin:    General: Skin is warm and dry.  Neurological:     Mental Status: He is alert.      UC Treatments / Results  Labs (all labs ordered are listed, but only abnormal results are displayed) Labs Reviewed - No data to display  EKG   Radiology No results found.  Procedures Procedures (including critical care time)  Medications Ordered in UC Medications - No data to display  Initial Impression / Assessment and Plan / UC Course  I have reviewed the triage vital signs and the nursing notes.  Pertinent labs & imaging results that were available during my care of the patient were reviewed by me and considered in my medical decision making (see chart  for details).     URI symptoms greater than 2 weeks, will treat for sinusitis with Augmentin as well as 5-day course of prednisone to further help with sinus inflammation.  Continue Claritin, will have further defer Flonase to avoid any further nasal bleeding.  Push fluids.   Discussed strict return precautions. Patient verbalized understanding and is agreeable with plan.  Final Clinical Impressions(s) / UC Diagnoses   Final diagnoses:  Acute sinusitis with symptoms > 10 days     Discharge Instructions     Begin Augmentin  twice daily for 1 week Prednisone daily x 5 days, take with food and in the morning if able Follow up if not improving   ED Prescriptions    Medication Sig Dispense Auth. Provider   amoxicillin-clavulanate (AUGMENTIN) 875-125 MG tablet Take 1 tablet by mouth every 12 (twelve) hours for 7 days. 14 tablet Demarrion Meiklejohn C, PA-C   predniSONE (DELTASONE) 50 MG tablet Take 1 tablet (50 mg total) by mouth daily for 5 days. 5 tablet Breon Rehm, [place] C, PA-C     PDMP not reviewed this encounter.   Lew Dawes, [place] 10/09/19 [phone]

## 2021-06-30 ENCOUNTER — Other Ambulatory Visit: Payer: Self-pay

## 2021-06-30 ENCOUNTER — Encounter (HOSPITAL_COMMUNITY): Payer: Self-pay

## 2021-06-30 ENCOUNTER — Ambulatory Visit (HOSPITAL_COMMUNITY)
Admission: RE | Admit: 2021-06-30 | Discharge: 2021-06-30 | Disposition: A | Discharge status: Home / Self Care | Payer: No Typology Code available for payment source | Source: Ambulatory Visit | Attending: Emergency Medicine | Admitting: Emergency Medicine

## 2021-06-30 VITALS — BP 115/75 | HR 69 | Temp 97.9°F | Resp 18

## 2021-06-30 DIAGNOSIS — H66001 Acute suppurative otitis media without spontaneous rupture of ear drum, right ear: Secondary | ICD-10-CM

## 2021-06-30 MED ORDER — AMOXICILLIN 500 MG PO CAPS: 500.0000 mg | ORAL_CAPSULE | Freq: Two times a day (BID) | ORAL | 0 refills | Status: AC

## 2021-06-30 NOTE — ED Provider Notes (Signed)
[facility]    CSN: [id number] Arrival date & time: 06/30/21  1454      History   Chief Complaint Chief Complaint  Patient presents with   APPOINTMENT : Ear Pain    HPI Brent Moreno is a 30 y.o. male.   Patient here for right ear pain and decreased hearing that has been ongoing for the past several days.  Reports initially started as an itching but has since progressed to pain.  Denies any discharge.  Denies any nasal congestion or sore throat.  Has not taken any OTC medications or treatments.  Denies any trauma, injury, or other precipitating event.  Denies any specific alleviating or aggravating factors.  Denies any fevers, chest pain, shortness of breath, N/V/D, numbness, tingling, weakness, abdominal pain, or headaches.     The history is provided by the patient.   History reviewed. No pertinent past medical history.  Patient Active Problem List   Diagnosis Date Noted   ANAL PRURITUS 07/23/2009   CERUMEN IMPACTION, LEFT 05/18/2009   ELEVATED BLOOD PRESSURE WITHOUT DIAGNOSIS OF HYPERTENSION 05/18/2009   PINWORMS 05/14/2009   REDNESS OR DISCHARGE OF EYE 10/04/2008   UPPER RESPIRATORY INFECTION, VIRAL 10/04/2008   RHABDOMYOLYSIS 09/06/2007   BACK PAIN 08/16/2007    History reviewed. No pertinent surgical history.     Home Medications    Prior to Admission medications   Medication Sig Start Date End Date Taking? Authorizing Provider  amoxicillin (AMOXIL) 500 MG capsule Take 1 capsule (500 mg total) by mouth 2 (two) times daily for 7 days. 06/30/21 07/07/21 Yes Ivette Loyal, NP  acetaminophen (TYLENOL) 500 MG tablet Take 500 mg by mouth every 6 (six) hours as needed.    [provider]  azelastine (ASTELIN) 0.1 % nasal spray Place 1 spray into both nostrils 2 (two) times daily. Use in each nostril as directed 11/05/18   Zachery Dauer, NP  ibuprofen (ADVIL,MOTRIN) 200 MG tablet Take 400 mg by mouth every 6 (six) hours as needed. For pain     [provider]    Family History Family History  Family history unknown: Yes    Social History Social History   Tobacco Use   Smoking status: Never   Smokeless tobacco: Never  Substance Use Topics   Alcohol use: No   Drug use: No     Allergies   Patient has no known allergies.   Review of Systems Review of Systems  HENT:  Positive for ear pain and hearing loss.   All other systems reviewed and are negative.   Physical Exam Triage Vital Signs ED Triage Vitals  Enc Vitals Group     BP 06/30/21 1525 115/75     Pulse Rate 06/30/21 1525 69     Resp 06/30/21 1525 18     Temp 06/30/21 1525 97.9 F (36.6 C)     Temp Source 06/30/21 1525 Oral     SpO2 06/30/21 1525 97 %     Weight --      Height --      Head Circumference --      Peak Flow --      Pain Score 06/30/21 1527 6     Pain Loc --      Pain Edu? --      Excl. in [place]? --    No data found.  Updated Vital Signs BP 115/75 (BP Location: Left Arm)   Pulse 69   Temp 97.9 F (36.6 C) (Oral)  Resp 18   SpO2 97%   Visual Acuity Right Eye Distance:   Left Eye Distance:   Bilateral Distance:    Right Eye Near:   Left Eye Near:    Bilateral Near:     Physical Exam Vitals and nursing note reviewed.  Constitutional:      General: He is not in acute distress.    Appearance: Normal appearance. He is not ill-appearing, toxic-appearing or diaphoretic.  HENT:     Head: Normocephalic and atraumatic.     Right Ear: Swelling and tenderness present. Tympanic membrane is erythematous and bulging.     Left Ear: Hearing, tympanic membrane, ear canal and external ear normal.  Eyes:     Conjunctiva/sclera: Conjunctivae normal.  Cardiovascular:     Rate and Rhythm: Normal rate.     Pulses: Normal pulses.  Pulmonary:     Effort: Pulmonary effort is normal.  Abdominal:     General: Abdomen is flat.  Musculoskeletal:        General: Normal range of motion.     Cervical back: Normal range of motion.   Skin:    General: Skin is warm and dry.  Neurological:     General: No focal deficit present.     Mental Status: He is alert and oriented to person, place, and time.  Psychiatric:        Mood and Affect: Mood normal.     UC Treatments / Results  Labs (all labs ordered are listed, but only abnormal results are displayed) Labs Reviewed - No data to display  EKG   Radiology No results found.  Procedures Procedures (including critical care time)  Medications Ordered in UC Medications - No data to display  Initial Impression / Assessment and Plan / UC Course  I have reviewed the triage vital signs and the nursing notes.  Pertinent labs & imaging results that were available during my care of the patient were reviewed by me and considered in my medical decision making (see chart for details).    Assessment negative for red flags or concerns.  Likely otitis media of the right ear.  Will treat with amoxicillin twice daily for the next 7 days.  Tylenol and/or ibuprofen as needed.  Recommend avoiding using earbuds or hearing aids until antibiotics are completed and devices should be disinfected prior to her use.  Follow-up for reevaluation as needed. Final Clinical Impressions(s) / UC Diagnoses   Final diagnoses:  Non-recurrent acute suppurative otitis media of right ear without spontaneous rupture of tympanic membrane     Discharge Instructions      Take the amoxicillin twice a day for the next 7 days.   You can take Ibuprofen and/or Tylenol as needed for pain relief and fever reduction.    Hearing aids, "ear-buds," and other similar devices should not be worn until antibiotics are completed.  These devices should be cleaned and disinfected prior to re-use.      ED Prescriptions     Medication Sig Dispense Auth. Provider   amoxicillin (AMOXIL) 500 MG capsule Take 1 capsule (500 mg total) by mouth 2 (two) times daily for 7 days. 14 capsule Ivette Loyal, NP       PDMP not reviewed this encounter.   Ivette Loyal, NP 06/30/21 [phone]

## 2021-06-30 NOTE — ED Triage Notes (Signed)
Pt presents with right ear pain and muffling the past few days.

## 2021-06-30 NOTE — Discharge Instructions (Signed)
Take the amoxicillin twice a day for the next 7 days.   You can take Ibuprofen and/or Tylenol as needed for pain relief and fever reduction.    Hearing aids, "ear-buds," and other similar devices should not be worn until antibiotics are completed.  These devices should be cleaned and disinfected prior to re-use.

## 2021-07-02 ENCOUNTER — Ambulatory Visit (HOSPITAL_COMMUNITY): Payer: Self-pay

## 2022-04-25 ENCOUNTER — Ambulatory Visit: Payer: Self-pay

## 2022-04-25 ENCOUNTER — Other Ambulatory Visit: Payer: Self-pay | Admitting: Family Medicine

## 2022-04-25 DIAGNOSIS — S0033XA Contusion of nose, initial encounter: Secondary | ICD-10-CM

## 2022-06-09 ENCOUNTER — Ambulatory Visit
Admission: RE | Admit: 2022-06-09 | Discharge: 2022-06-09 | Disposition: A | Discharge status: Home / Self Care | Payer: No Typology Code available for payment source | Source: Ambulatory Visit | Attending: Emergency Medicine | Admitting: Emergency Medicine

## 2022-06-09 VITALS — BP 116/73 | HR 60 | Temp 98.6°F | Resp 98

## 2022-06-09 DIAGNOSIS — H60333 Swimmer's ear, bilateral: Secondary | ICD-10-CM | POA: Diagnosis not present

## 2022-06-09 MED ORDER — CIPRO HC 0.2-1 % OT SUSP: 3.0000 [drp] | Freq: Two times a day (BID) | OTIC | 0 refills | Status: DC

## 2022-06-09 NOTE — ED Provider Notes (Signed)
HPI  SUBJECTIVE:  Brent Moreno is a 31 y.o. male who presents with 2 days of bilateral ear burning pain, itching after returning from a 5-day vacation at the beach where he was doing lots of swimming.  No fevers, change in hearing, otorrhea.  He reports getting clear liquid onto his finger when he inserts his finger into the ear.  He also wears ear buds.  Denies Q-tip use.  No URI symptoms.  No antibiotics in the past month.  No antipyretic in the past 6 hours.  He tried ibuprofen with improvement in symptoms.  past medical history negative for diabetes.  [facility]    History reviewed. No pertinent past medical history.  History reviewed. No pertinent surgical history.  Family History  Family history unknown: Yes    Social History   Tobacco Use   Smoking status: Never   Smokeless tobacco: Never  Substance Use Topics   Alcohol use: No   Drug use: No    No current facility-administered medications for this encounter.  Current Outpatient Medications:    ciprofloxacin-hydrocortisone (CIPRO HC) OTIC suspension, Place 3 drops into both ears 2 (two) times daily. X 7 days, Disp: 10 mL, Rfl: 0   acetaminophen (TYLENOL) 500 MG tablet, Take 500 mg by mouth every 6 (six) hours as needed., Disp: , Rfl:    azelastine (ASTELIN) 0.1 % nasal spray, Place 1 spray into both nostrils 2 (two) times daily. Use in each nostril as directed, Disp: 30 mL, Rfl: 0   ibuprofen (ADVIL,MOTRIN) 200 MG tablet, Take 400 mg by mouth every 6 (six) hours as needed. For pain, Disp: , Rfl:   No Known Allergies   ROS  As noted in HPI.   Physical Exam  BP 116/73   Pulse 60   Temp 98.6 F (37 C)   Resp (!) 98   SpO2 98%   Constitutional: Well developed, well nourished, no acute distress Eyes:  EOMI, conjunctiva normal bilaterally HENT: Normocephalic, atraumatic,mucus membranes moist.  Positive white debris in bilateral ears.  Bilateral EAC slightly erythematous, but not swollen.  Bilateral TMs pearly  gray, intact.  No pain with traction on pinna, palpation of tragus or mastoid bilaterally. Neck: No cervical lymphadenopathy Respiratory: Normal inspiratory effort Cardiovascular: Normal rate GI: nondistended skin: No rash, skin intact Musculoskeletal: no deformities Neurologic: Alert & oriented x 3, no focal neuro deficits Psychiatric: Speech and behavior appropriate   ED Course   Medications - No data to display  No orders of the defined types were placed in this encounter.   No results found for this or any previous visit (from the past 24 hour(s)). No results found.  ED Clinical Impression  1. Acute swimmer's ear of both sides      ED Assessment/Plan      Patient with bilateral otitis externa.  Will send home with Cipro HC.  Advised 50/50 mix of white vinegar and alcohol after swimming in the future to prevent this.  Follow-up with PCP as needed.  Discussed MDM, treatment plan, and plan for follow-up with patient. patient agrees with plan.   Meds ordered this encounter  Medications   ciprofloxacin-hydrocortisone (CIPRO HC) OTIC suspension    Sig: Place 3 drops into both ears 2 (two) times daily. X 7 days    Dispense:  10 mL    Refill:  0      *This clinic note was created using Scientist, clinical (histocompatibility and immunogenetics). Therefore, there may be occasional mistakes despite careful proofreading.  ?  Domenick Gong, MD 06/10/22 [phone]

## 2022-06-09 NOTE — Discharge Instructions (Addendum)
Finish the Cipro [facility], even if you feel better.  Put a 50/50 mix of white vinegar and rubbing alcohol into your ears after swimming to prevent swimmer's ear.

## 2022-06-09 NOTE — ED Triage Notes (Signed)
Pt here with bilateral ear irritation and burning since coming back from the beach about 3 days ago.

## 2022-06-10 ENCOUNTER — Telehealth: Payer: Self-pay | Admitting: Emergency Medicine

## 2022-06-10 MED ORDER — CIPROFLOXACIN HCL 0.2 % OT SOLN: 0.2000 mL | Freq: Two times a day (BID) | OTIC | 0 refills | Status: AC

## 2022-06-10 MED ORDER — CIPROFLOXACIN HCL 0.3 % OP SOLN: OPHTHALMIC | 0 refills | Status: DC

## 2022-06-10 NOTE — Telephone Encounter (Signed)
Patient called stating he could not afford Ciprodex which was prescribed for his bilateral swimmer's ear.  Prescription changed to ciprofloxacin drops which are affordable.  Prescription sent to [facility].

## 2022-10-10 DIAGNOSIS — R1012 Left upper quadrant pain: Secondary | ICD-10-CM | POA: Diagnosis not present

## 2022-10-10 DIAGNOSIS — Z800 Family history of malignant neoplasm of digestive organs: Secondary | ICD-10-CM | POA: Diagnosis not present

## 2022-10-10 DIAGNOSIS — R194 Change in bowel habit: Secondary | ICD-10-CM | POA: Diagnosis not present

## 2022-11-24 ENCOUNTER — Ambulatory Visit (HOSPITAL_COMMUNITY)
Admission: RE | Admit: 2022-11-24 | Discharge: 2022-11-24 | Disposition: A | Discharge status: Home / Self Care | Payer: 59 | Source: Ambulatory Visit | Attending: Internal Medicine | Admitting: Internal Medicine

## 2022-11-24 VITALS — BP 127/81 | HR 83 | Temp 98.7°F | Resp 12

## 2022-11-24 DIAGNOSIS — J069 Acute upper respiratory infection, unspecified: Secondary | ICD-10-CM | POA: Diagnosis not present

## 2022-11-24 MED ORDER — PROMETHAZINE-DM 6.25-15 MG/5ML PO SYRP: 5.0000 mL | ORAL_SOLUTION | Freq: Every evening | ORAL | 0 refills | Status: DC | PRN

## 2022-11-24 MED ORDER — BENZONATATE 100 MG PO CAPS: 100.0000 mg | ORAL_CAPSULE | Freq: Three times a day (TID) | ORAL | 0 refills | Status: DC

## 2022-11-24 NOTE — ED Provider Notes (Signed)
[place]    CSN: [id number] Arrival date & time: 11/24/22  1515      History   Chief Complaint Chief Complaint  Patient presents with   Cough    Been sick for 5 days. Symtoms include body ache, possible fever, cough, congestion, slight shortness of breath at times, headache at times, fatigue, coughing up mucus at times. - Entered by patient    HPI Brent Moreno is a 32 y.o. male.   Patient presents to urgent care for evaluation of cough, nasal congestion, body aches, cold chills and hot sweats, and generalized fatigue for the last 5 days.  No known sick contacts with similar symptoms.  Cough is productive with clear sputum.  No history of chronic respiratory problems.  He is not a smoker and denies drug use.  No history of chronic medical problems for which she takes medications daily.  He is tolerating food and fluids well without nausea, vomiting, abdominal pain, diarrhea, or urinary symptoms.  He has not checked his temperature at home due to lack of accurate thermometer but states he feels like he has had a fever.  He has been using over-the-counter cold and flu medicines without much relief of symptoms.   Cough   No past medical history on file.  Patient Active Problem List   Diagnosis Date Noted   ANAL PRURITUS 07/23/2009   CERUMEN IMPACTION, LEFT 05/18/2009   ELEVATED BLOOD PRESSURE WITHOUT DIAGNOSIS OF HYPERTENSION 05/18/2009   PINWORMS 05/14/2009   REDNESS OR DISCHARGE OF EYE 10/04/2008   UPPER RESPIRATORY INFECTION, VIRAL 10/04/2008   RHABDOMYOLYSIS 09/06/2007   BACK PAIN 08/16/2007    No past surgical history on file.     Home Medications    Prior to Admission medications   Medication Sig Start Date End Date Taking? Authorizing Provider  benzonatate (TESSALON) 100 MG capsule Take 1 capsule (100 mg total) by mouth every 8 (eight) hours. 11/24/22  Yes Talbot Grumbling, FNP  promethazine-dextromethorphan (PROMETHAZINE-DM) 6.25-15 MG/5ML  syrup Take 5 mLs by mouth at bedtime as needed for cough. 11/24/22  Yes Talbot Grumbling, FNP  acetaminophen (TYLENOL) 500 MG tablet Take 500 mg by mouth every 6 (six) hours as needed.    [provider]  azelastine (ASTELIN) 0.1 % nasal spray Place 1 spray into both nostrils 2 (two) times daily. Use in each nostril as directed 11/05/18   Shella Maxim, NP  ciprofloxacin (CILOXAN) 0.3 % ophthalmic solution Administer 4 drops into each ear twice daily for the next 7 days. 06/10/22   Lynden Oxford Scales, PA-C  ciprofloxacin-hydrocortisone (CIPRO HC) OTIC suspension Place 3 drops into both ears 2 (two) times daily. X 7 days 06/09/22   Melynda Ripple, MD  ibuprofen (ADVIL,MOTRIN) 200 MG tablet Take 400 mg by mouth every 6 (six) hours as needed. For pain    [provider]    Family History Family History  Family history unknown: Yes    Social History Social History   Tobacco Use   Smoking status: Never   Smokeless tobacco: Never  Substance Use Topics   Alcohol use: No   Drug use: No     Allergies   Patient has no known allergies.   Review of Systems Review of Systems  Respiratory:  Positive for cough.   Per HPI   Physical Exam Triage Vital Signs ED Triage Vitals [11/24/22 1544]  Enc Vitals Group     BP 127/81     Pulse Rate 83  Resp 12     Temp 98.7 F (37.1 C)     Temp Source Oral     SpO2 98 %     Weight      Height      Head Circumference      Peak Flow      Pain Score      Pain Loc      Pain Edu?      Excl. in [place]?    No data found.  Updated Vital Signs BP 127/81 (BP Location: Left Arm)   Pulse 83   Temp 98.7 F (37.1 C) (Oral)   Resp 12   SpO2 98%   Visual Acuity Right Eye Distance:   Left Eye Distance:   Bilateral Distance:    Right Eye Near:   Left Eye Near:    Bilateral Near:     Physical Exam Vitals and nursing note reviewed.  Constitutional:      Appearance: He is not ill-appearing or toxic-appearing.  HENT:      Head: Normocephalic and atraumatic.     Right Ear: Hearing, tympanic membrane, ear canal and external ear normal.     Left Ear: Hearing, tympanic membrane, ear canal and external ear normal.     Nose: Congestion present.     Mouth/Throat:     Lips: Pink.     Mouth: Mucous membranes are moist. No injury.     Tongue: No lesions. Tongue does not deviate from midline.     Palate: No mass and lesions.     Pharynx: Oropharynx is clear. Uvula midline. Posterior oropharyngeal erythema present. No pharyngeal swelling, oropharyngeal exudate or uvula swelling.     Tonsils: No tonsillar exudate or tonsillar abscesses.  Eyes:     General: Lids are normal. Vision grossly intact. Gaze aligned appropriately.     Extraocular Movements: Extraocular movements intact.     Conjunctiva/sclera: Conjunctivae normal.  Cardiovascular:     Rate and Rhythm: Normal rate and regular rhythm.     Heart sounds: Normal heart sounds, S1 normal and S2 normal.  Pulmonary:     Effort: Pulmonary effort is normal. No respiratory distress.     Breath sounds: Normal breath sounds and air entry. No wheezing, rhonchi or rales.  Chest:     Chest wall: No tenderness.  Musculoskeletal:     Cervical back: Neck supple.  Lymphadenopathy:     Cervical: No cervical adenopathy.  Skin:    General: Skin is warm and dry.     Capillary Refill: Capillary refill takes less than 2 seconds.     Findings: No rash.  Neurological:     General: No focal deficit present.     Mental Status: He is alert and oriented to person, place, and time. Mental status is at baseline.     Cranial Nerves: No dysarthria or facial asymmetry.  Psychiatric:        Mood and Affect: Mood normal.        Speech: Speech normal.        Behavior: Behavior normal.        Thought Content: Thought content normal.        Judgment: Judgment normal.      UC Treatments / Results  Labs (all labs ordered are listed, but only abnormal results are displayed) Labs  Reviewed - No data to display  EKG   Radiology No results found.  Procedures Procedures (including critical care time)  Medications Ordered in UC Medications -  No data to display  Initial Impression / Assessment and Plan / UC Course  I have reviewed the triage vital signs and the nursing notes.  Pertinent labs & imaging results that were available during my care of the patient were reviewed by me and considered in my medical decision making (see chart for details).   1. Viral URI with cough Symptoms and physical exam consistent with a viral upper respiratory tract infection that will likely resolve with rest, fluids, and prescriptions for symptomatic relief. Deferred imaging based on stable cardiopulmonary exam and hemodynamically stable vital signs.  Deferred viral testing due to timing of illness, patient is agreeable with this.  Tessalon Perles and Promethazine DM sent to pharmacy for symptomatic relief to be taken as prescribed.  May continue taking over the counter medications as directed for further symptomatic relief.  Drowsiness precautions discussed regarding promethazine DM prescription.  Nonpharmacologic interventions for symptom relief provided and after visit summary below. Advised to push fluids to stay well hydrated while recovering from viral illness.   Discussed physical exam and available lab work findings in clinic with patient.  Counseled patient regarding appropriate use of medications and potential side effects for all medications recommended or prescribed today. Discussed red flag signs and symptoms of worsening condition,when to call the PCP office, return to urgent care, and when to seek higher level of care in the emergency department. Patient verbalizes understanding and agreement with plan. All questions answered. Patient discharged in stable condition.    Final Clinical Impressions(s) / UC Diagnoses   Final diagnoses:  Viral URI with cough     Discharge  Instructions      You have a viral upper respiratory infection.   Use the following medicines to help with symptoms: - Plain Mucinex (guaifenesin) over the counter as directed every 12 hours to thin mucous so that you are able to get it out of your body easier. Drink plenty of water while taking this medication so that it works well in your body (at least 8 cups a day).  - Tylenol 1,'000mg'$  and/or ibuprofen '600mg'$  every 6 hours with food as needed for aches/pains or fever/chills.  - Tessalon perles every 8 hours as needed for cough. - Take Promethazine DM cough medication to help with your cough at nighttime so that you are able to sleep. Do not drive, drink alcohol, or go to work while taking this medication since it can make you sleepy. Only take this at nighttime.   1 tablespoon of honey in warm water and/or salt water gargles may also help with symptoms. Humidifier to your room will help add water to the air and reduce coughing.  If you develop any new or worsening symptoms, please return.  If your symptoms are severe, please go to the emergency room.  Follow-up with your primary care provider for further evaluation and management of your symptoms as well as ongoing wellness visits.  I hope you feel better!    ED Prescriptions     Medication Sig Dispense Auth. Provider   benzonatate (TESSALON) 100 MG capsule Take 1 capsule (100 mg total) by mouth every 8 (eight) hours. 21 capsule Talbot Grumbling, FNP   promethazine-dextromethorphan (PROMETHAZINE-DM) 6.25-15 MG/5ML syrup Take 5 mLs by mouth at bedtime as needed for cough. 118 mL Talbot Grumbling, FNP      PDMP not reviewed this encounter.   Talbot Grumbling, [place] 11/24/22 1630

## 2022-11-24 NOTE — ED Triage Notes (Signed)
Pt is here due to a cough, body aches, chills, headache. Sore throat runny nose, nasal congestion, low energy x 5days

## 2022-11-24 NOTE — Discharge Instructions (Signed)
You have a viral upper respiratory infection.   Use the following medicines to help with symptoms: - Plain Mucinex (guaifenesin) over the counter as directed every 12 hours to thin mucous so that you are able to get it out of your body easier. Drink plenty of water while taking this medication so that it works well in your body (at least 8 cups a day).  - Tylenol 1,'000mg'$  and/or ibuprofen '600mg'$  every 6 hours with food as needed for aches/pains or fever/chills.  - Tessalon perles every 8 hours as needed for cough. - Take Promethazine DM cough medication to help with your cough at nighttime so that you are able to sleep. Do not drive, drink alcohol, or go to work while taking this medication since it can make you sleepy. Only take this at nighttime.   1 tablespoon of honey in warm water and/or salt water gargles may also help with symptoms. Humidifier to your room will help add water to the air and reduce coughing.  If you develop any new or worsening symptoms, please return.  If your symptoms are severe, please go to the emergency room.  Follow-up with your primary care provider for further evaluation and management of your symptoms as well as ongoing wellness visits.  I hope you feel better!

## 2022-11-28 DIAGNOSIS — Z800 Family history of malignant neoplasm of digestive organs: Secondary | ICD-10-CM | POA: Diagnosis not present

## 2022-11-28 DIAGNOSIS — K219 Gastro-esophageal reflux disease without esophagitis: Secondary | ICD-10-CM | POA: Diagnosis not present

## 2022-11-28 DIAGNOSIS — R1084 Generalized abdominal pain: Secondary | ICD-10-CM | POA: Diagnosis not present

## 2022-12-01 DIAGNOSIS — B9689 Other specified bacterial agents as the cause of diseases classified elsewhere: Secondary | ICD-10-CM | POA: Diagnosis not present

## 2022-12-01 DIAGNOSIS — M940 Chondrocostal junction syndrome [Tietze]: Secondary | ICD-10-CM | POA: Diagnosis not present

## 2022-12-01 DIAGNOSIS — J019 Acute sinusitis, unspecified: Secondary | ICD-10-CM | POA: Diagnosis not present

## 2022-12-01 DIAGNOSIS — R002 Palpitations: Secondary | ICD-10-CM | POA: Diagnosis not present

## 2022-12-01 DIAGNOSIS — R058 Other specified cough: Secondary | ICD-10-CM | POA: Diagnosis not present

## 2022-12-08 DIAGNOSIS — M542 Cervicalgia: Secondary | ICD-10-CM | POA: Diagnosis not present

## 2022-12-08 DIAGNOSIS — J019 Acute sinusitis, unspecified: Secondary | ICD-10-CM | POA: Diagnosis not present

## 2022-12-08 DIAGNOSIS — R079 Chest pain, unspecified: Secondary | ICD-10-CM | POA: Diagnosis not present

## 2023-01-09 DIAGNOSIS — K648 Other hemorrhoids: Secondary | ICD-10-CM | POA: Diagnosis not present

## 2023-01-09 DIAGNOSIS — Z800 Family history of malignant neoplasm of digestive organs: Secondary | ICD-10-CM | POA: Diagnosis not present

## 2023-01-09 DIAGNOSIS — R1084 Generalized abdominal pain: Secondary | ICD-10-CM | POA: Diagnosis not present

## 2023-01-09 DIAGNOSIS — K635 Polyp of colon: Secondary | ICD-10-CM | POA: Diagnosis not present

## 2023-05-12 DIAGNOSIS — J3489 Other specified disorders of nose and nasal sinuses: Secondary | ICD-10-CM | POA: Diagnosis not present

## 2023-05-12 DIAGNOSIS — J069 Acute upper respiratory infection, unspecified: Secondary | ICD-10-CM | POA: Diagnosis not present

## 2023-10-05 ENCOUNTER — Emergency Department (HOSPITAL_BASED_OUTPATIENT_CLINIC_OR_DEPARTMENT_OTHER): Payer: Non-veteran care

## 2023-10-05 ENCOUNTER — Encounter (HOSPITAL_BASED_OUTPATIENT_CLINIC_OR_DEPARTMENT_OTHER): Payer: Self-pay | Admitting: Emergency Medicine

## 2023-10-05 ENCOUNTER — Other Ambulatory Visit: Payer: Self-pay

## 2023-10-05 ENCOUNTER — Emergency Department (HOSPITAL_BASED_OUTPATIENT_CLINIC_OR_DEPARTMENT_OTHER)
Admission: EM | Admit: 2023-10-05 | Discharge: 2023-10-05 | Disposition: A | Discharge status: Home / Self Care | Payer: Non-veteran care | Attending: Emergency Medicine | Admitting: Emergency Medicine

## 2023-10-05 DIAGNOSIS — N5082 Scrotal pain: Secondary | ICD-10-CM | POA: Diagnosis not present

## 2023-10-05 DIAGNOSIS — I861 Scrotal varices: Secondary | ICD-10-CM

## 2023-10-05 DIAGNOSIS — N50811 Right testicular pain: Secondary | ICD-10-CM | POA: Diagnosis present

## 2023-10-05 LAB — URINALYSIS, ROUTINE W REFLEX MICROSCOPIC
Bilirubin Urine: NEGATIVE
Glucose, UA: NEGATIVE mg/dL
Hgb urine dipstick: NEGATIVE
Ketones, ur: NEGATIVE mg/dL
Leukocytes,Ua: NEGATIVE
Nitrite: NEGATIVE
Protein, ur: NEGATIVE mg/dL
Specific Gravity, Urine: 1.014 (ref 1.005–1.030)
pH: 6.5 (ref 5.0–8.0)

## 2023-10-05 NOTE — Discharge Instructions (Addendum)
 Your scrotal ultrasound showed no evidence of torsion, did show evidence of a small varicocele, recommend scrotal support and NSAIDs as needed for pain control, follow-up outpatient with urology.  Your urinalysis was negative.

## 2023-10-05 NOTE — ED Provider Notes (Signed)
 [name] EMERGENCY DEPARTMENT AT [facility] Provider Note  CSN: [id number] Arrival date & time: 10/05/23 1324  Chief Complaint(s) Testicle Pain  HPI Brent Moreno is a 33 y.o. 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  He saw his provider at the [facility] who referred him to urology, he has not received any sort of urological procedure in regards to the presumed torsion.  Reports that he has not been confirmed on imaging in the past.  Reports 3 days ago had pain to his right testicle, attempted to twist his testicle and the pain is somewhat improved.  He reports that he has a sensation intermittently and will twist his testicle and will report improvement to the pain.  No penile discharge, no dysuria urgency.  No abdominal pain.  Recent sexual intercourse in the past week or so that was protected.  No concern for STI verbalized by patient  Past Medical History History reviewed. No pertinent past medical history. Patient Active Problem List   Diagnosis Date Noted   ANAL PRURITUS 07/23/2009   CERUMEN IMPACTION, LEFT 05/18/2009   ELEVATED BLOOD PRESSURE WITHOUT DIAGNOSIS OF HYPERTENSION 05/18/2009   PINWORMS 05/14/2009   REDNESS OR DISCHARGE OF EYE 10/04/2008   UPPER RESPIRATORY INFECTION, VIRAL 10/04/2008   RHABDOMYOLYSIS 09/06/2007   BACK PAIN 08/16/2007   Home Medication(s) Prior to Admission medications   Medication Sig Start Date End Date Taking? Authorizing Provider  acetaminophen (TYLENOL) 500 MG tablet Take 500 mg by mouth every 6 (six) hours as needed.    [provider]  azelastine  (ASTELIN ) 0.1 % nasal spray Place 1 spray into both nostrils 2 (two) times daily. Use in each nostril as directed 11/05/18   Graham, Elysa, NP  benzonatate   (TESSALON ) 100 MG capsule Take 1 capsule (100 mg total) by mouth every 8 (eight) hours. 11/24/22   Enedelia Dorna HERO, FNP  ciprofloxacin  (CILOXAN ) 0.3 % ophthalmic solution Administer 4 drops into each ear twice daily for the next 7 days. 06/10/22   Joesph Shaver Scales, PA-C  ciprofloxacin -hydrocortisone  (CIPRO  HC) OTIC suspension Place 3 drops into both ears 2 (two) times daily. X 7 days 06/09/22   Van Knee, MD  ibuprofen (ADVIL,MOTRIN) 200 MG tablet Take 400 mg by mouth every 6 (six) hours as needed. For pain    [provider]  promethazine -dextromethorphan (PROMETHAZINE -DM) 6.25-15 MG/5ML syrup Take 5 mLs by mouth at bedtime as needed for cough. 11/24/22   Enedelia Dorna HERO, FNP                                                                                                                                    Past Surgical History History reviewed. No pertinent surgical history. Family History  Family History  Family history unknown: Yes    Social History Social History   Tobacco Use   Smoking status: Never   Smokeless tobacco: Never  Substance Use Topics   Alcohol use: No   Drug use: No   Allergies Patient has no known allergies.  Review of Systems Review of Systems  Constitutional:  Negative for fever.  Respiratory:  Negative for cough and shortness of breath.   Cardiovascular:  Negative for chest pain and palpitations.  Gastrointestinal:  Negative for abdominal pain and nausea.  Genitourinary:  Positive for testicular pain. Negative for hematuria, penile discharge, penile pain and scrotal swelling.  Musculoskeletal:  Negative for arthralgias.  Neurological:  Negative for numbness.  All other systems reviewed and are negative.   Physical Exam Vital Signs  I have reviewed the triage vital signs BP (!) 148/99 (BP Location: Right Arm)   Pulse 71   Temp 98 F (36.7 C)   Resp 16   SpO2 98%  Physical Exam Vitals and nursing note reviewed. Exam  conducted with a chaperone present Estate Manager/land Agent).  Constitutional:      General: He is not in acute distress.    Appearance: Normal appearance. He is well-developed. He is not ill-appearing.  HENT:     Head: Normocephalic and atraumatic.     Right Ear: External ear normal.     Left Ear: External ear normal.     Nose: Nose normal.     Mouth/Throat:     Mouth: Mucous membranes are moist.  Eyes:     General: No scleral icterus.       Right eye: No discharge.        Left eye: No discharge.  Cardiovascular:     Rate and Rhythm: Normal rate.  Pulmonary:     Effort: Pulmonary effort is normal. No respiratory distress.     Breath sounds: No stridor.  Abdominal:     General: Abdomen is flat. There is no distension.     Tenderness: There is no guarding.  Genitourinary:    Testes: Normal. Cremasteric reflex is present.        Right: Mass, tenderness or swelling not present. Cremasteric reflex is present.         Left: Mass, tenderness or swelling not present. Cremasteric reflex is present.   Musculoskeletal:        General: No deformity.     Cervical back: No rigidity.  Skin:    General: Skin is warm and dry.     Coloration: Skin is not cyanotic, jaundiced or pale.  Neurological:     Mental Status: He is alert and oriented to person, place, and time.     GCS: GCS eye subscore is 4. GCS verbal subscore is 5. GCS motor subscore is 6.  Psychiatric:        Speech: Speech normal.        Behavior: Behavior normal. Behavior is cooperative.     ED Results and Treatments Labs (all labs ordered are listed, but only abnormal results are displayed) Labs Reviewed  URINALYSIS, ROUTINE W REFLEX MICROSCOPIC  GC/CHLAMYDIA PROBE AMP ([place]) NOT AT [facility]  Radiology   Pertinent labs & imaging results that were available during my care of the patient were reviewed  by me and considered in my medical decision making (see MDM for details).  Medications Ordered in ED Medications - No data to display                                                                                                                                   Procedures Procedures  (including critical care time)  Medical Decision Making / ED Course    Medical Decision Making:    Brent Moreno is a 33 y.o. male with past medical history as below, significant for pinworms, elevated blood pressure who presents to the ED with complaint of right testicle pain. The complaint involves an extensive differential diagnosis and also carries with it a high risk of complications and morbidity.  Serious etiology was considered. Ddx includes but is not limited to: Epididymitis, testicular torsion, varicocele, hydrocele, etc.  Complete initial physical exam performed, notably the patient was in no distress.    Reviewed and confirmed nursing documentation for past medical history, family history, social history.  Vital signs reviewed.         Brief summary: 33 year old male with report history of intermittent testicular torsion here with right testicle pain x 3 days.  GU exam is reassuring, testicles and scrotum appear normal.  Cremasteric reflexes present bilateral.  Will get scrotal ultrasound to evaluate, send urine and GC chlamydia.  Reports his pain is mild.  Handoff to incoming EDP pending imaging and urinalysis.  Would likely benefit from ongoing urology follow-up                Additional history obtained: -Additional history obtained from na -External records from outside source obtained and reviewed including: Chart review including previous notes, labs, imaging, consultation notes including  Urgent care documentation, home medications, prior labs   Lab Tests: -I ordered, reviewed, and interpreted labs.   The pertinent results include:   Labs Reviewed   URINALYSIS, ROUTINE W REFLEX MICROSCOPIC  GC/CHLAMYDIA PROBE AMP ([place]) NOT AT [facility]    Notable for pending  EKG   EKG Interpretation Date/Time:    Ventricular Rate:    PR Interval:    QRS Duration:    QT Interval:    QTC Calculation:   R Axis:      Text Interpretation:           Imaging Studies ordered: I ordered imaging studies including scrotal ultrasound > PENDING   Medicines ordered and prescription drug management: No orders of the defined types were placed in this encounter.   -I have reviewed the patients home medicines and have made adjustments as needed   Consultations Obtained: na   Cardiac Monitoring: Continuous pulse oximetry interpreted by myself, 99% on RA.    Social Determinants of Health:  Diagnosis or treatment significantly limited by social determinants  of health: na   Reevaluation: After the interventions noted above, I reevaluated the patient and found that they have stayed the same  Co morbidities that complicate the patient evaluation History reviewed. No pertinent past medical history.    Dispostion: Disposition decision including need for hospitalization was considered, and patient disposition pending at time of sign out.    Final Clinical Impression(s) / ED Diagnoses Final diagnoses:  Scrotal pain        Elnor Jayson LABOR, DO 10/05/23 1615

## 2023-10-05 NOTE — ED Triage Notes (Signed)
 Right testicle pain x a few days Hx of testicle "flipping" seen at [facility] for it

## 2023-10-05 NOTE — ED Notes (Signed)
 Pt alert and oriented X 4 at the time of discharge. RR even and unlabored. No acute distress noted. Pt verbalized understanding of discharge instructions as discussed. Pt ambulatory to lobby at time of discharge.

## 2023-10-05 NOTE — ED Provider Notes (Signed)
  Physical Exam  BP (!) 148/99 (BP Location: Right Arm)   Pulse 71   Temp 98 F (36.7 C)   Resp 16   SpO2 98%   Physical Exam  Procedures  Procedures  ED Course / MDM    Medical Decision Making Amount and/or Complexity of Data Reviewed Labs: ordered. Radiology: ordered.   55M with reported hx of torsion, [facility], [facility] 'det[facility].' Waiting on testicular US , UA pending.     Testicular US : IMPRESSION:  1. No evidence of testicular torsion.  2. Small left varicocele.    UA: Negative for UTI, patient not concern for STI, asymptomatic with no dysuria or frequency.  Will not empirically treat at this time.  Patient with reassuring evaluation, advised scrotal support and NSAIDs, advised follow-up outpatient with urology for continued outpatient management.       Jerrol Agent, MD 10/05/23 [phone]

## 2023-10-06 LAB — GC/CHLAMYDIA PROBE AMP (~~LOC~~) NOT AT ARMC
Chlamydia: NEGATIVE
Comment: NEGATIVE
Comment: NORMAL
Neisseria Gonorrhea: NEGATIVE

## 2024-03-21 ENCOUNTER — Telehealth: Payer: Self-pay | Admitting: Neurology

## 2024-03-21 NOTE — Telephone Encounter (Signed)
 Reschedule appointment 07/25/24 2:30 pm

## 2024-03-21 NOTE — Telephone Encounter (Signed)
 LVM and sent mychart msg informing pt of need to reschedule 07/04/24 appt - MD out

## 2024-07-04 ENCOUNTER — Ambulatory Visit: Admitting: Neurology

## 2024-07-21 DIAGNOSIS — Z711 Person with feared health complaint in whom no diagnosis is made: Secondary | ICD-10-CM | POA: Insufficient documentation

## 2024-07-21 DIAGNOSIS — R531 Weakness: Secondary | ICD-10-CM | POA: Insufficient documentation

## 2024-07-21 DIAGNOSIS — M542 Cervicalgia: Secondary | ICD-10-CM | POA: Insufficient documentation

## 2024-07-21 DIAGNOSIS — R202 Paresthesia of skin: Secondary | ICD-10-CM | POA: Insufficient documentation

## 2024-07-21 DIAGNOSIS — B349 Viral infection, unspecified: Secondary | ICD-10-CM | POA: Insufficient documentation

## 2024-07-21 DIAGNOSIS — Z719 Counseling, unspecified: Secondary | ICD-10-CM | POA: Insufficient documentation

## 2024-07-21 DIAGNOSIS — H6990 Unspecified Eustachian tube disorder, unspecified ear: Secondary | ICD-10-CM | POA: Insufficient documentation

## 2024-07-21 DIAGNOSIS — K219 Gastro-esophageal reflux disease without esophagitis: Secondary | ICD-10-CM | POA: Insufficient documentation

## 2024-07-21 DIAGNOSIS — H5213 Myopia, bilateral: Secondary | ICD-10-CM | POA: Insufficient documentation

## 2024-07-21 DIAGNOSIS — R350 Frequency of micturition: Secondary | ICD-10-CM | POA: Insufficient documentation

## 2024-07-21 DIAGNOSIS — N50819 Testicular pain, unspecified: Secondary | ICD-10-CM | POA: Insufficient documentation

## 2024-07-21 DIAGNOSIS — M62830 Muscle spasm of back: Secondary | ICD-10-CM | POA: Insufficient documentation

## 2024-07-21 DIAGNOSIS — M26629 Arthralgia of temporomandibular joint, unspecified side: Secondary | ICD-10-CM | POA: Insufficient documentation

## 2024-07-21 DIAGNOSIS — R002 Palpitations: Secondary | ICD-10-CM | POA: Insufficient documentation

## 2024-07-21 DIAGNOSIS — S93401A Sprain of unspecified ligament of right ankle, initial encounter: Secondary | ICD-10-CM | POA: Insufficient documentation

## 2024-07-21 DIAGNOSIS — M546 Pain in thoracic spine: Secondary | ICD-10-CM | POA: Insufficient documentation

## 2024-07-21 DIAGNOSIS — R42 Dizziness and giddiness: Secondary | ICD-10-CM | POA: Insufficient documentation

## 2024-07-25 ENCOUNTER — Ambulatory Visit (INDEPENDENT_AMBULATORY_CARE_PROVIDER_SITE_OTHER): Admitting: Neurology

## 2024-07-25 ENCOUNTER — Encounter: Payer: Self-pay | Admitting: Neurology

## 2024-07-25 VITALS — BP 146/98 | HR 80 | Ht 74.0 in | Wt 228.0 lb

## 2024-07-25 DIAGNOSIS — R531 Weakness: Secondary | ICD-10-CM | POA: Diagnosis not present

## 2024-07-25 DIAGNOSIS — R4189 Other symptoms and signs involving cognitive functions and awareness: Secondary | ICD-10-CM

## 2024-07-25 NOTE — Progress Notes (Signed)
 Chief Complaint  Patient presents with   New Patient (Initial Visit)    Rm15, alone, New Patient referral from [facility] Borum MD [phone] (Tina)/paresthesias, weakness, lightheadedness) orthostatic bp completed      ASSESSMENT AND PLAN  Brent Moreno is a 33 y.o. male   Constellation of complaints,  Brain fog, intermittent tachycardia, difficulty sleeping,  Essentially normal neurological examination,  Already had normal MRI of the brain, cervical, thoracic spine through [facility] in July 2025  Laboratory evaluation to rule out metabolic derangement, thyroid malfunction, if laboratory evaluation showed no significant abnormality, no neurological evaluation is needed, will continue follow-up with his primary care  DIAGNOSTIC DATA (LABS, IMAGING, TESTING) - I reviewed patient records, labs, notes, testing and imaging myself where available.   MEDICAL HISTORY:  Brent Moreno is a 33 year old male, seen in request by his primary care doctor Teresa Channel for brain fog, difficulty sleeping,  History is obtained from the patient and review of electronic medical records. I personally reviewed pertinent available imaging films in PACS.   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  PHYSICAL EXAMNIATION:  Gen: NAD, conversant, well nourised, well groomed                     Cardiovascular: Regular rate rhythm, no peripheral edema, warm, nontender. Eyes: Conjunctivae clear without exudates or hemorrhage Neck: Supple, no carotid bruits. Pulmonary: Clear to auscultation bilaterally   NEUROLOGICAL EXAM:  MENTAL STATUS: Speech/cognition: Awake, alert, oriented to history taking and casual conversation CRANIAL NERVES: CN II: Visual fields are full to confrontation. Pupils are round equal and briskly reactive to light. CN III, IV, VI: extraocular movement are normal. No ptosis. CN V: Facial sensation is intact to light touch CN VII: Face is symmetric with normal eye closure  CN VIII: Hearing is normal to causal conversation. CN IX, X: Phonation is normal. CN XI: Head turning and shoulder shrug are intact  MOTOR: There is no pronator drift of out-stretched arms. Muscle bulk and tone are normal. Muscle strength is normal.  REFLEXES: Reflexes are 2+ and symmetric at the biceps, triceps, knees, and ankles. Plantar responses are flexor.  SENSORY: Intact to light touch, pinprick and vibratory sensation are intact in fingers and toes.  COORDINATION: There is no trunk or limb dysmetria noted.  GAIT/STANCE: Posture is normal. Gait is steady with normal steps, base, arm swing, and turning. Heel and toe walking are normal. Tandem gait is normal.  Romberg is absent.  REVIEW OF SYSTEMS:  Full 14 system  review of systems performed and notable only for as above All other review of systems were negative.   ALLERGIES: Allergies  Allergen Reactions   Amlodipine Nausea Only and Other (See Comments)    Abdominal pain/ cramping    HOME MEDICATIONS: Current Outpatient Medications   Medication Sig Dispense Refill   acetaminophen (TYLENOL) 500 MG tablet Take 500 mg by mouth every 6 (six) hours as needed.     azelastine  (ASTELIN ) 0.1 % nasal spray Place 1 spray into both nostrils 2 (two) times daily. Use in each nostril as directed 30 mL 0   ibuprofen (ADVIL,MOTRIN) 200 MG tablet Take 400 mg by mouth every 6 (six) hours as needed. For pain     methocarbamol (ROBAXIN) 750 MG tablet Take 750 mg by mouth 4 (four) times daily.     propranolol (INDERAL) 10 MG tablet Take 5 mg by mouth 2 (two) times daily.     No current facility-administered medications for this visit.    PAST MEDICAL HISTORY: History reviewed. No pertinent past medical history.  PAST SURGICAL HISTORY: History reviewed. No pertinent surgical history.  FAMILY HISTORY: Family History  Family history unknown: Yes    SOCIAL HISTORY: Social History   Socioeconomic History   Marital status: Single    Spouse name: Not on file   Number of children: 0   Years of education: Not on file   Highest education level: Some college, no degree  Occupational History   Not on file  Tobacco Use   Smoking status: Never    Passive exposure: Never   Smokeless tobacco: Never  Vaping Use   Vaping status: Never Used  Substance and Sexual Activity   Alcohol use: No   Drug use: No   Sexual activity: Yes    Birth control/protection: None  Other Topics Concern   Not on file  Social History Narrative   Not on file   Social Drivers of Health   Financial Resource Strain: Not on file  Food Insecurity: Not on file  Transportation Needs: Not on file  Physical Activity: Not on file  Stress: Not on file  Social Connections: Not on file  Intimate Partner Violence: Not on file      Modena Callander, M.D. Ph.D.  [facility] [address] [place], [place] [address] Ph: [phone] Fax: [phone]  CC:  Delana Corean GRADE, MD [phone] [facility] [place],  [place] [address]   Teresa Channel, MD

## 2024-07-26 ENCOUNTER — Ambulatory Visit: Payer: Self-pay | Admitting: Neurology

## 2024-07-26 LAB — COMPREHENSIVE METABOLIC PANEL WITH GFR
ALT: 28 IU/L (ref 0–44)
AST: 27 IU/L (ref 0–40)
Albumin: 4.9 g/dL (ref 4.1–5.1)
Alkaline Phosphatase: 79 IU/L (ref 47–123)
BUN/Creatinine Ratio: 13 (ref 9–20)
BUN: 15 mg/dL (ref 6–20)
Bilirubin Total: 0.6 mg/dL (ref 0.0–1.2)
CO2: 23 mmol/L (ref 20–29)
Calcium: 9.5 mg/dL (ref 8.7–10.2)
Chloride: 101 mmol/L (ref 96–106)
Creatinine, Ser: 1.14 mg/dL (ref 0.76–1.27)
Globulin, Total: 2 g/dL (ref 1.5–4.5)
Glucose: 96 mg/dL (ref 70–99)
Potassium: 4.3 mmol/L (ref 3.5–5.2)
Sodium: 139 mmol/L (ref 134–144)
Total Protein: 6.9 g/dL (ref 6.0–8.5)
eGFR: 87 mL/min/1.73 (ref >59)

## 2024-07-26 LAB — HIV ANTIBODY (ROUTINE TESTING W REFLEX): HIV Screen 4th Generation wRfx: NONREACTIVE

## 2024-07-26 LAB — CBC WITH DIFFERENTIAL/PLATELET
Basophils Absolute: 0.1 x10E3/uL (ref 0.0–0.2)
Basos: 1 %
EOS (ABSOLUTE): 0.2 x10E3/uL (ref 0.0–0.4)
Eos: 3 %
Hematocrit: 51 % (ref 37.5–51.0)
Hemoglobin: 17.2 g/dL (ref 13.0–17.7)
Immature Grans (Abs): 0 x10E3/uL (ref 0.0–0.1)
Immature Granulocytes: 0 %
Lymphocytes Absolute: 1.5 x10E3/uL (ref 0.7–3.1)
Lymphs: 27 %
MCH: 31 pg (ref 26.6–33.0)
MCHC: 33.7 g/dL (ref 31.5–35.7)
MCV: 92 fL (ref 79–97)
Monocytes Absolute: 0.5 x10E3/uL (ref 0.1–0.9)
Monocytes: 9 %
Neutrophils Absolute: 3.4 x10E3/uL (ref 1.4–7.0)
Neutrophils: 60 %
Platelets: 246 x10E3/uL (ref 150–450)
RBC: 5.54 x10E6/uL (ref 4.14–5.80)
RDW: 12.7 % (ref 11.6–15.4)
WBC: 5.7 x10E3/uL (ref 3.4–10.8)

## 2024-07-26 LAB — CK: Total CK: 429 U/L (ref 49–439)

## 2024-07-26 LAB — FOLATE: Folate: 11 ng/mL (ref >3.0)

## 2024-07-26 LAB — C-REACTIVE PROTEIN: CRP: <1 mg/L (ref 0–10)

## 2024-07-26 LAB — SEDIMENTATION RATE: Sed Rate: 4 mm/h (ref 0–15)

## 2024-07-26 LAB — RPR: RPR Ser Ql: NONREACTIVE

## 2024-07-26 LAB — VITAMIN D 25 HYDROXY (VIT D DEFICIENCY, FRACTURES): Vit D, 25-Hydroxy: 42.1 ng/mL (ref 30.0–100.0)

## 2024-07-26 LAB — TSH: TSH: 0.955 u[IU]/mL (ref 0.450–4.500)

## 2024-07-26 LAB — VITAMIN B12: Vitamin B-12: 758 pg/mL (ref 232–1245)
